# Patient Record
Sex: Male | Born: 1970 | Race: White | Hispanic: No | Marital: Single | Smoking: Former smoker
Health system: Southern US, Community
[De-identification: ages and names within clinical notes are randomized; demographics above are authoritative.]

## PROBLEM LIST (undated history)

## (undated) DIAGNOSIS — G8929 Other chronic pain: Secondary | ICD-10-CM

## (undated) DIAGNOSIS — Z87442 Personal history of urinary calculi: Secondary | ICD-10-CM

## (undated) DIAGNOSIS — R531 Weakness: Secondary | ICD-10-CM

## (undated) DIAGNOSIS — M549 Dorsalgia, unspecified: Secondary | ICD-10-CM

## (undated) DIAGNOSIS — M1990 Unspecified osteoarthritis, unspecified site: Secondary | ICD-10-CM

## (undated) HISTORY — PX: BACK SURGERY: SHX140

## (undated) HISTORY — PX: LASIK: SHX215

---

## 1996-11-11 HISTORY — PX: KNEE CARTILAGE SURGERY: SHX688

## 2012-01-19 ENCOUNTER — Emergency Department (HOSPITAL_COMMUNITY): Payer: BC Managed Care – PPO

## 2012-01-19 ENCOUNTER — Inpatient Hospital Stay (HOSPITAL_COMMUNITY)
Admission: EM | Admit: 2012-01-19 | Discharge: 2012-01-22 | DRG: 442 | Disposition: A | Discharge status: Home Health | Payer: BC Managed Care – PPO | Source: Ambulatory Visit | Attending: Surgery | Admitting: Surgery

## 2012-01-19 ENCOUNTER — Encounter (HOSPITAL_COMMUNITY): Payer: Self-pay | Admitting: *Deleted

## 2012-01-19 DIAGNOSIS — S91311A Laceration without foreign body, right foot, initial encounter: Secondary | ICD-10-CM | POA: Diagnosis present

## 2012-01-19 DIAGNOSIS — S0292XB Unspecified fracture of facial bones, initial encounter for open fracture: Secondary | ICD-10-CM

## 2012-01-19 DIAGNOSIS — S02402A Zygomatic fracture, unspecified, initial encounter for closed fracture: Secondary | ICD-10-CM | POA: Diagnosis present

## 2012-01-19 DIAGNOSIS — S0280XA Fracture of other specified skull and facial bones, unspecified side, initial encounter for closed fracture: Secondary | ICD-10-CM

## 2012-01-19 DIAGNOSIS — S0180XA Unspecified open wound of other part of head, initial encounter: Secondary | ICD-10-CM

## 2012-01-19 DIAGNOSIS — Z7289 Other problems related to lifestyle: Secondary | ICD-10-CM

## 2012-01-19 DIAGNOSIS — S02401A Maxillary fracture, unspecified, initial encounter for closed fracture: Secondary | ICD-10-CM | POA: Diagnosis present

## 2012-01-19 DIAGNOSIS — F1010 Alcohol abuse, uncomplicated: Secondary | ICD-10-CM | POA: Diagnosis present

## 2012-01-19 DIAGNOSIS — S02401B Maxillary fracture, unspecified, initial encounter for open fracture: Secondary | ICD-10-CM | POA: Diagnosis present

## 2012-01-19 DIAGNOSIS — S3991XA Unspecified injury of abdomen, initial encounter: Secondary | ICD-10-CM

## 2012-01-19 DIAGNOSIS — S91309A Unspecified open wound, unspecified foot, initial encounter: Secondary | ICD-10-CM

## 2012-01-19 DIAGNOSIS — R4182 Altered mental status, unspecified: Secondary | ICD-10-CM

## 2012-01-19 DIAGNOSIS — S02400B Malar fracture unspecified, initial encounter for open fracture: Secondary | ICD-10-CM | POA: Diagnosis present

## 2012-01-19 DIAGNOSIS — F1210 Cannabis abuse, uncomplicated: Secondary | ICD-10-CM | POA: Diagnosis present

## 2012-01-19 DIAGNOSIS — S0285XA Fracture of orbit, unspecified, initial encounter for closed fracture: Secondary | ICD-10-CM | POA: Diagnosis present

## 2012-01-19 DIAGNOSIS — S0181XA Laceration without foreign body of other part of head, initial encounter: Secondary | ICD-10-CM | POA: Diagnosis present

## 2012-01-19 DIAGNOSIS — S0280XB Fracture of other specified skull and facial bones, unspecified side, initial encounter for open fracture: Secondary | ICD-10-CM | POA: Diagnosis present

## 2012-01-19 DIAGNOSIS — T07XXXA Unspecified multiple injuries, initial encounter: Secondary | ICD-10-CM

## 2012-01-19 DIAGNOSIS — S058X9A Other injuries of unspecified eye and orbit, initial encounter: Secondary | ICD-10-CM | POA: Diagnosis present

## 2012-01-19 DIAGNOSIS — S0230XB Fracture of orbital floor, unspecified side, initial encounter for open fracture: Secondary | ICD-10-CM | POA: Diagnosis present

## 2012-01-19 DIAGNOSIS — T148XXA Other injury of unspecified body region, initial encounter: Secondary | ICD-10-CM

## 2012-01-19 DIAGNOSIS — F10929 Alcohol use, unspecified with intoxication, unspecified: Secondary | ICD-10-CM

## 2012-01-19 DIAGNOSIS — S298XXA Other specified injuries of thorax, initial encounter: Secondary | ICD-10-CM

## 2012-01-19 DIAGNOSIS — S91109A Unspecified open wound of unspecified toe(s) without damage to nail, initial encounter: Principal | ICD-10-CM | POA: Diagnosis present

## 2012-01-19 DIAGNOSIS — S01119A Laceration without foreign body of unspecified eyelid and periocular area, initial encounter: Secondary | ICD-10-CM

## 2012-01-19 DIAGNOSIS — S02109B Fracture of base of skull, unspecified side, initial encounter for open fracture: Secondary | ICD-10-CM | POA: Diagnosis present

## 2012-01-19 DIAGNOSIS — F1090 Alcohol use, unspecified, uncomplicated: Secondary | ICD-10-CM

## 2012-01-19 LAB — CBC
HCT: 50.7 % (ref 39.0–52.0)
Hemoglobin: 18.7 g/dL — ABNORMAL HIGH (ref 13.0–17.0)
MCV: 86.8 fL (ref 78.0–100.0)
RBC: 5.84 MIL/uL — ABNORMAL HIGH (ref 4.22–5.81)
WBC: 15.4 10*3/uL — ABNORMAL HIGH (ref 4.0–10.5)

## 2012-01-19 LAB — DIFFERENTIAL
Basophils Absolute: 0 10*3/uL (ref 0.0–0.1)
Basophils Relative: 0 % (ref 0–1)
Eosinophils Relative: 2 % (ref 0–5)
Lymphocytes Relative: 35 % (ref 12–46)
Monocytes Relative: 7 % (ref 3–12)
Neutro Abs: 8.6 10*3/uL — ABNORMAL HIGH (ref 1.7–7.7)

## 2012-01-19 LAB — BASIC METABOLIC PANEL
CO2: 18 mEq/L — ABNORMAL LOW (ref 19–32)
Chloride: 102 mEq/L (ref 96–112)
GFR calc Af Amer: >90 mL/min (ref >90)
Potassium: 3.4 mEq/L — ABNORMAL LOW (ref 3.5–5.1)

## 2012-01-19 LAB — TYPE AND SCREEN: Antibody Screen: NEGATIVE

## 2012-01-19 LAB — ETHANOL: Alcohol, Ethyl (B): 247 mg/dL — ABNORMAL HIGH (ref 0–11)

## 2012-01-19 MED ORDER — IOHEXOL 300 MG/ML  SOLN
100.0000 mL | Freq: Once | INTRAMUSCULAR | Status: AC | PRN
  Administered 2012-01-19: 100 mL via INTRAVENOUS

## 2012-01-19 MED ORDER — FENTANYL CITRATE 0.05 MG/ML IJ SOLN
50.0000 ug | Freq: Once | INTRAMUSCULAR | Status: AC
  Administered 2012-01-19: 50 ug via INTRAVENOUS
  Filled 2012-01-19: qty 2

## 2012-01-19 MED ORDER — SODIUM CHLORIDE 0.9 % IV BOLUS (SEPSIS): 1000.0000 mL | Freq: Once | INTRAVENOUS | Status: DC

## 2012-01-19 MED ORDER — SODIUM CHLORIDE 0.9 % IV BOLUS (SEPSIS)
2000.0000 mL | Freq: Once | INTRAVENOUS | Status: AC
  Administered 2012-01-19: 2000 mL via INTRAVENOUS

## 2012-01-19 NOTE — ED Notes (Signed)
X-ray at bedside

## 2012-01-19 NOTE — ED Notes (Signed)
Pt sleeping. 
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
## 2012-01-19 NOTE — ED Notes (Signed)
Patient transported to CT 
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
## 2012-01-20 ENCOUNTER — Inpatient Hospital Stay (HOSPITAL_COMMUNITY): Payer: BC Managed Care – PPO | Admitting: Anesthesiology

## 2012-01-20 ENCOUNTER — Encounter (HOSPITAL_COMMUNITY): Admission: EM | Disposition: A | Discharge status: Home Health | Payer: Self-pay | Source: Ambulatory Visit

## 2012-01-20 ENCOUNTER — Emergency Department (HOSPITAL_COMMUNITY): Payer: BC Managed Care – PPO

## 2012-01-20 ENCOUNTER — Encounter (HOSPITAL_COMMUNITY): Payer: Self-pay

## 2012-01-20 ENCOUNTER — Encounter (HOSPITAL_COMMUNITY): Payer: Self-pay | Admitting: Anesthesiology

## 2012-01-20 DIAGNOSIS — Z7289 Other problems related to lifestyle: Secondary | ICD-10-CM

## 2012-01-20 DIAGNOSIS — S0181XA Laceration without foreign body of other part of head, initial encounter: Secondary | ICD-10-CM | POA: Diagnosis present

## 2012-01-20 DIAGNOSIS — T148XXA Other injury of unspecified body region, initial encounter: Secondary | ICD-10-CM | POA: Diagnosis present

## 2012-01-20 DIAGNOSIS — S02402A Zygomatic fracture, unspecified, initial encounter for closed fracture: Secondary | ICD-10-CM | POA: Diagnosis present

## 2012-01-20 DIAGNOSIS — S0285XA Fracture of orbit, unspecified, initial encounter for closed fracture: Secondary | ICD-10-CM | POA: Diagnosis present

## 2012-01-20 DIAGNOSIS — S91311A Laceration without foreign body, right foot, initial encounter: Secondary | ICD-10-CM | POA: Diagnosis present

## 2012-01-20 DIAGNOSIS — S02401A Maxillary fracture, unspecified, initial encounter for closed fracture: Secondary | ICD-10-CM | POA: Diagnosis present

## 2012-01-20 DIAGNOSIS — F1290 Cannabis use, unspecified, uncomplicated: Secondary | ICD-10-CM | POA: Insufficient documentation

## 2012-01-20 HISTORY — PX: LACERATION REPAIR: SHX5284

## 2012-01-20 HISTORY — PX: I & D EXTREMITY: SHX5045

## 2012-01-20 LAB — BASIC METABOLIC PANEL
BUN: 10 mg/dL (ref 6–23)
CO2: 20 mEq/L (ref 19–32)
GFR calc non Af Amer: >90 mL/min (ref >90)
Glucose, Bld: 138 mg/dL — ABNORMAL HIGH (ref 70–99)
Potassium: 4.1 mEq/L (ref 3.5–5.1)
Sodium: 138 mEq/L (ref 135–145)

## 2012-01-20 LAB — URINALYSIS, ROUTINE W REFLEX MICROSCOPIC
Ketones, ur: NEGATIVE mg/dL
Leukocytes, UA: NEGATIVE
Nitrite: NEGATIVE
Specific Gravity, Urine: >1.046 — ABNORMAL HIGH (ref 1.005–1.030)
Urobilinogen, UA: 0.2 mg/dL (ref 0.0–1.0)
pH: 5 (ref 5.0–8.0)

## 2012-01-20 LAB — RAPID URINE DRUG SCREEN, HOSP PERFORMED
Barbiturates: NOT DETECTED
Benzodiazepines: NOT DETECTED
Cocaine: NOT DETECTED
Opiates: NOT DETECTED
Tetrahydrocannabinol: POSITIVE — AB

## 2012-01-20 LAB — CBC
HCT: 46.7 % (ref 39.0–52.0)
Hemoglobin: 16.6 g/dL (ref 13.0–17.0)
MCHC: 35.5 g/dL (ref 30.0–36.0)
RBC: 5.33 MIL/uL (ref 4.22–5.81)

## 2012-01-20 SURGERY — REPAIR, LACERATION, 2 OR MORE
Anesthesia: General | Site: Foot | Laterality: Right | Wound class: Dirty or Infected

## 2012-01-20 MED ORDER — NALOXONE HCL 0.4 MG/ML IJ SOLN: 0.4000 mg | INTRAMUSCULAR | Status: DC | PRN

## 2012-01-20 MED ORDER — ONDANSETRON HCL 4 MG/2ML IJ SOLN
4.0000 mg | Freq: Once | INTRAMUSCULAR | Status: AC
  Administered 2012-01-20: 4 mg via INTRAVENOUS
  Filled 2012-01-20: qty 2

## 2012-01-20 MED ORDER — SODIUM CHLORIDE 0.9 % IR SOLN
Status: DC | PRN
  Administered 2012-01-20: 2000 mL

## 2012-01-20 MED ORDER — CEFAZOLIN SODIUM 1-5 GM-% IV SOLN
1.0000 g | Freq: Once | INTRAVENOUS | Status: AC
  Administered 2012-01-20: 1 g via INTRAVENOUS
  Filled 2012-01-20: qty 50

## 2012-01-20 MED ORDER — HYDROMORPHONE HCL PF 1 MG/ML IJ SOLN
1.0000 mg | INTRAMUSCULAR | Status: DC | PRN
  Administered 2012-01-20 (×2): 1 mg via INTRAVENOUS
  Administered 2012-01-21 (×4): 2 mg via INTRAVENOUS
  Filled 2012-01-20 (×2): qty 2
  Filled 2012-01-20: qty 1
  Filled 2012-01-20: qty 2
  Filled 2012-01-20: qty 1
  Filled 2012-01-20: qty 2

## 2012-01-20 MED ORDER — FENTANYL CITRATE 0.05 MG/ML IJ SOLN
INTRAMUSCULAR | Status: DC | PRN
  Administered 2012-01-20 (×3): 100 ug via INTRAVENOUS

## 2012-01-20 MED ORDER — ACETAMINOPHEN 10 MG/ML IV SOLN
1000.0000 mg | Freq: Once | INTRAVENOUS | Status: AC | PRN
  Administered 2012-01-20: 1000 mg via INTRAVENOUS
  Filled 2012-01-20: qty 100

## 2012-01-20 MED ORDER — MORPHINE SULFATE 4 MG/ML IJ SOLN
4.0000 mg | Freq: Once | INTRAMUSCULAR | Status: AC
  Administered 2012-01-20: 4 mg via INTRAVENOUS
  Filled 2012-01-20: qty 1

## 2012-01-20 MED ORDER — PROPOFOL 10 MG/ML IV EMUL
INTRAVENOUS | Status: DC | PRN
  Administered 2012-01-20: 170 mg via INTRAVENOUS

## 2012-01-20 MED ORDER — NICOTINE 21 MG/24HR TD PT24
21.0000 mg | MEDICATED_PATCH | Freq: Every day | TRANSDERMAL | Status: DC
  Administered 2012-01-20 – 2012-01-22 (×3): 21 mg via TRANSDERMAL
  Filled 2012-01-20 (×3): qty 1

## 2012-01-20 MED ORDER — NEOSTIGMINE METHYLSULFATE 1 MG/ML IJ SOLN
INTRAMUSCULAR | Status: DC | PRN
  Administered 2012-01-20: 5 mg via INTRAVENOUS

## 2012-01-20 MED ORDER — TETANUS-DIPHTH-ACELL PERTUSSIS 5-2.5-18.5 LF-MCG/0.5 IM SUSP
0.5000 mL | Freq: Once | INTRAMUSCULAR | Status: AC
  Administered 2012-01-20: 0.5 mL via INTRAMUSCULAR
  Filled 2012-01-20: qty 0.5

## 2012-01-20 MED ORDER — BACITRACIN ZINC 500 UNIT/GM EX OINT
TOPICAL_OINTMENT | Freq: Two times a day (BID) | CUTANEOUS | Status: DC
  Administered 2012-01-20 – 2012-01-22 (×5): via TOPICAL
  Filled 2012-01-20 (×2): qty 15

## 2012-01-20 MED ORDER — ONDANSETRON HCL 4 MG/2ML IJ SOLN: 4.0000 mg | Freq: Once | INTRAMUSCULAR | Status: DC | PRN

## 2012-01-20 MED ORDER — LIDOCAINE-EPINEPHRINE 2 %-1:100000 IJ SOLN
INTRAMUSCULAR | Status: DC | PRN
  Administered 2012-01-20: 10 mL via INTRADERMAL

## 2012-01-20 MED ORDER — SUCCINYLCHOLINE CHLORIDE 20 MG/ML IJ SOLN
INTRAMUSCULAR | Status: DC | PRN
  Administered 2012-01-20: 100 mg via INTRAVENOUS

## 2012-01-20 MED ORDER — SODIUM CHLORIDE 0.9 % IJ SOLN: 9.0000 mL | INTRAMUSCULAR | Status: DC | PRN

## 2012-01-20 MED ORDER — VECURONIUM BROMIDE 10 MG IV SOLR
INTRAVENOUS | Status: DC | PRN
  Administered 2012-01-20: 4 mg via INTRAVENOUS

## 2012-01-20 MED ORDER — HYDROMORPHONE HCL PF 1 MG/ML IJ SOLN
INTRAMUSCULAR | Status: AC
  Filled 2012-01-20: qty 1

## 2012-01-20 MED ORDER — ACETAMINOPHEN 325 MG PO TABS: 650.0000 mg | ORAL_TABLET | ORAL | Status: DC | PRN

## 2012-01-20 MED ORDER — DIPHENHYDRAMINE HCL 50 MG/ML IJ SOLN: 12.5000 mg | Freq: Four times a day (QID) | INTRAMUSCULAR | Status: DC | PRN

## 2012-01-20 MED ORDER — DIPHENHYDRAMINE HCL 12.5 MG/5ML PO ELIX
12.5000 mg | ORAL_SOLUTION | Freq: Four times a day (QID) | ORAL | Status: DC | PRN
  Administered 2012-01-21: 12.5 mg via ORAL
  Filled 2012-01-20: qty 5

## 2012-01-20 MED ORDER — MORPHINE SULFATE 2 MG/ML IJ SOLN
2.0000 mg | INTRAMUSCULAR | Status: DC | PRN
  Administered 2012-01-20 (×3): 2 mg via INTRAVENOUS
  Filled 2012-01-20 (×4): qty 1

## 2012-01-20 MED ORDER — OXYCODONE HCL 5 MG PO TABS: 5.0000 mg | ORAL_TABLET | ORAL | Status: DC | PRN

## 2012-01-20 MED ORDER — CEFAZOLIN SODIUM 1-5 GM-% IV SOLN
1.0000 g | Freq: Three times a day (TID) | INTRAVENOUS | Status: DC
  Administered 2012-01-20 – 2012-01-21 (×4): 1 g via INTRAVENOUS
  Filled 2012-01-20 (×6): qty 50

## 2012-01-20 MED ORDER — ONDANSETRON HCL 4 MG PO TABS: 4.0000 mg | ORAL_TABLET | Freq: Four times a day (QID) | ORAL | Status: DC | PRN

## 2012-01-20 MED ORDER — ENOXAPARIN SODIUM 40 MG/0.4ML ~~LOC~~ SOLN
40.0000 mg | SUBCUTANEOUS | Status: DC
  Administered 2012-01-20 – 2012-01-21 (×2): 40 mg via SUBCUTANEOUS
  Filled 2012-01-20 (×3): qty 0.4

## 2012-01-20 MED ORDER — WHITE PETROLATUM GEL
Status: AC
  Filled 2012-01-20: qty 5

## 2012-01-20 MED ORDER — ONDANSETRON HCL 4 MG/2ML IJ SOLN: 4.0000 mg | Freq: Four times a day (QID) | INTRAMUSCULAR | Status: DC | PRN

## 2012-01-20 MED ORDER — HYDROMORPHONE 0.3 MG/ML IV SOLN
INTRAVENOUS | Status: AC
  Filled 2012-01-20: qty 25

## 2012-01-20 MED ORDER — MORPHINE SULFATE 4 MG/ML IJ SOLN: 4.0000 mg | Freq: Once | INTRAMUSCULAR | Status: DC

## 2012-01-20 MED ORDER — BSS IO SOLN
INTRAOCULAR | Status: DC | PRN
  Administered 2012-01-20: 1 via INTRAOCULAR

## 2012-01-20 MED ORDER — HYDROMORPHONE HCL PF 1 MG/ML IJ SOLN
0.2500 mg | INTRAMUSCULAR | Status: DC | PRN
  Administered 2012-01-20: 0.5 mg via INTRAVENOUS

## 2012-01-20 MED ORDER — ALUM & MAG HYDROXIDE-SIMETH 200-200-20 MG/5ML PO SUSP
30.0000 mL | Freq: Four times a day (QID) | ORAL | Status: DC | PRN
  Administered 2012-01-20 – 2012-01-21 (×2): 30 mL via ORAL
  Filled 2012-01-20 (×5): qty 30

## 2012-01-20 MED ORDER — BACITRACIN-NEOMYCIN-POLYMYXIN 400-5-5000 EX OINT
TOPICAL_OINTMENT | CUTANEOUS | Status: DC | PRN
  Administered 2012-01-20: 1 via TOPICAL

## 2012-01-20 MED ORDER — HYDROMORPHONE 0.3 MG/ML IV SOLN
INTRAVENOUS | Status: DC
  Administered 2012-01-20: 11:00:00 via INTRAVENOUS
  Administered 2012-01-20: 5.7 mg via INTRAVENOUS
  Administered 2012-01-20: 13 mg via INTRAVENOUS

## 2012-01-20 MED ORDER — DROPERIDOL 2.5 MG/ML IJ SOLN
INTRAMUSCULAR | Status: DC | PRN
  Administered 2012-01-20: 0.625 mg via INTRAVENOUS

## 2012-01-20 MED ORDER — DEXAMETHASONE SODIUM PHOSPHATE 4 MG/ML IJ SOLN
INTRAMUSCULAR | Status: DC | PRN
  Administered 2012-01-20: 6 mg via INTRAVENOUS

## 2012-01-20 MED ORDER — OXYCODONE HCL 5 MG PO TABS: 10.0000 mg | ORAL_TABLET | ORAL | Status: DC | PRN

## 2012-01-20 MED ORDER — SODIUM CHLORIDE 0.9 % IV SOLN: 0.1000 mg/kg | Freq: Once | INTRAVENOUS | Status: DC | PRN

## 2012-01-20 MED ORDER — LACTATED RINGERS IV SOLN
INTRAVENOUS | Status: DC | PRN
  Administered 2012-01-20: 03:00:00 via INTRAVENOUS

## 2012-01-20 MED ORDER — GLYCOPYRROLATE 0.2 MG/ML IJ SOLN
INTRAMUSCULAR | Status: DC | PRN
  Administered 2012-01-20: .6 mg via INTRAVENOUS

## 2012-01-20 MED ORDER — HYDROMORPHONE HCL PF 1 MG/ML IJ SOLN: 0.2500 mg | INTRAMUSCULAR | Status: DC | PRN

## 2012-01-20 MED ORDER — ONDANSETRON HCL 4 MG/2ML IJ SOLN
INTRAMUSCULAR | Status: DC | PRN
  Administered 2012-01-20: 4 mg via INTRAVENOUS

## 2012-01-20 MED ORDER — LIDOCAINE HCL (CARDIAC) 20 MG/ML IV SOLN
INTRAVENOUS | Status: DC | PRN
  Administered 2012-01-20: 40 mg via INTRAVENOUS
  Administered 2012-01-20: 100 mg via INTRAVENOUS

## 2012-01-20 MED ORDER — SODIUM CHLORIDE 0.9 % IV SOLN
INTRAVENOUS | Status: DC | PRN
  Administered 2012-01-20: 02:00:00 via INTRAVENOUS

## 2012-01-20 MED ORDER — SODIUM CHLORIDE 0.9 % IV SOLN
INTRAVENOUS | Status: DC
  Administered 2012-01-20 – 2012-01-21 (×3): via INTRAVENOUS

## 2012-01-20 SURGICAL SUPPLY — 30 items
BANDAGE COBAN STERILE 3 (GAUZE/BANDAGES/DRESSINGS) ×1 IMPLANT
BANDAGE ESMARK 6X9 LF (GAUZE/BANDAGES/DRESSINGS) IMPLANT
BLADE SURG 10 STRL SS (BLADE) ×1 IMPLANT
BLADE SURG 15 STRL LF DISP TIS (BLADE) IMPLANT
BLADE SURG 15 STRL SS (BLADE) ×3
BNDG CMPR 9X6 STRL LF SNTH (GAUZE/BANDAGES/DRESSINGS) ×4
BNDG COHESIVE 3X5 TAN STRL LF (GAUZE/BANDAGES/DRESSINGS) ×1 IMPLANT
BNDG ESMARK 6X9 LF (GAUZE/BANDAGES/DRESSINGS) ×6
COVER SURGICAL LIGHT HANDLE (MISCELLANEOUS) ×1 IMPLANT
DRAPE EXTREMITY T 121X128X90 (DRAPE) ×1 IMPLANT
GAUZE SPONGE 4X4 16PLY XRAY LF (GAUZE/BANDAGES/DRESSINGS) ×1 IMPLANT
GAUZE XEROFORM 1X8 LF (GAUZE/BANDAGES/DRESSINGS) ×1 IMPLANT
GLOVE BIO SURGEON STRL SZ 6.5 (GLOVE) ×1 IMPLANT
GLOVE BIO SURGEON STRL SZ7.5 (GLOVE) ×2 IMPLANT
GLOVE BIO SURGEON STRL SZ8.5 (GLOVE) ×1 IMPLANT
GLOVE BIOGEL PI IND STRL 7.0 (GLOVE) IMPLANT
GLOVE BIOGEL PI IND STRL 8 (GLOVE) IMPLANT
GLOVE BIOGEL PI INDICATOR 7.0 (GLOVE) ×1
GLOVE BIOGEL PI INDICATOR 8 (GLOVE) ×1
GLOVE ORTHO TXT STRL SZ7.5 (GLOVE) ×1 IMPLANT
GOWN STRL NON-REIN LRG LVL3 (GOWN DISPOSABLE) ×3 IMPLANT
NDL HYPO 25GX1X1/2 BEV (NEEDLE) IMPLANT
NEEDLE HYPO 25GX1X1/2 BEV (NEEDLE) ×3 IMPLANT
PAD ARMBOARD 7.5X6 YLW CONV (MISCELLANEOUS) ×1 IMPLANT
SPONGE GAUZE 4X4 12PLY (GAUZE/BANDAGES/DRESSINGS) ×1 IMPLANT
SUT CHROMIC 5 0 P 3 (SUTURE) ×1 IMPLANT
SUT ETHILON 5 0 P 3 18 (SUTURE) ×2
SUT NYLON ETHILON 5-0 P-3 1X18 (SUTURE) IMPLANT
TRAY ENT MC OR (CUSTOM PROCEDURE TRAY) ×1 IMPLANT
YANKAUER SUCT BULB TIP NO VENT (SUCTIONS) ×1 IMPLANT
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
## 2012-01-20 NOTE — ED Notes (Signed)
Pain meds given to assist in cleaning facial wounds.  Pt unable to tolerate even with morphine.

## 2012-01-20 NOTE — Anesthesia Postprocedure Evaluation (Signed)
  Anesthesia Post-op Note  Patient: Wayne Swanson  Procedure(s) Performed: Procedure(s) (LRB): REPAIR MULTIPLE LACERATIONS (Right) IRRIGATION AND DEBRIDEMENT EXTREMITY (Right)  Patient Location: PACU  Anesthesia Type: General  Level of Consciousness: awake, alert  and oriented  Airway and Oxygen Therapy: Patient Spontanous Breathing and Patient connected to nasal cannula oxygen  Post-op Pain: mild  Post-op Assessment: Post-op Vital signs reviewed and Patient's Cardiovascular Status Stable  Post-op Vital Signs: stable  Complications: No apparent anesthesia complications

## 2012-01-20 NOTE — Transfer of Care (Signed)
Immediate Anesthesia Transfer of Care Note  Patient: Wayne Swanson  Procedure(s) Performed: Procedure(s) (LRB): REPAIR MULTIPLE LACERATIONS (Right) IRRIGATION AND DEBRIDEMENT EXTREMITY (Right)  Patient Location: PACU  Anesthesia Type: General  Level of Consciousness: sedated and responds to stimulation  Airway & Oxygen Therapy: Patient Spontanous Breathing and Patient connected to face mask oxygen  Post-op Assessment: Report given to PACU RN, Post -op Vital signs reviewed and stable, Patient moving all extremities and Patient moving all extremities X 4  Post vital signs: Reviewed and stable  Complications: No apparent anesthesia complications

## 2012-01-20 NOTE — Preoperative (Signed)
Beta Blockers   Reason not to administer Beta Blockers:Not Applicable 
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
## 2012-01-20 NOTE — Progress Notes (Signed)
Pt set up on ETCO2 nasal cannula on PCA pump.  Alarm limits set and pt tolerating well at this time.  RT will continue to monitor.

## 2012-01-20 NOTE — Anesthesia Procedure Notes (Addendum)
Procedure Name: Intubation Date/Time: 01/20/2012 2:04 AM Performed by: Wray Kearns A Pre-anesthesia Checklist: Patient identified, Timeout performed, Emergency Drugs available, Suction available and Patient being monitored Patient Re-evaluated:Patient Re-evaluated prior to inductionOxygen Delivery Method: Circle system utilized Preoxygenation: Pre-oxygenation with 100% oxygen Intubation Type: IV induction, Rapid sequence and Cricoid Pressure applied Tube type: Oral Tube size: 8.0 mm Number of attempts: 1 Airway Equipment and Method: Rigid stylet and Video-laryngoscopy Placement Confirmation: ETT inserted through vocal cords under direct vision,  positive ETCO2,  CO2 detector and breath sounds checked- equal and bilateral Secured at: 23 cm Tube secured with: Tape Dental Injury: Teeth and Oropharynx as per pre-operative assessment  Comments: RSI ,Video laryngoscopy per Dr. Noreene Larsson   Date/Time: 01/20/2012 2:04 AM Performed by: Wray Kearns A Comments: Hard neck collar front removed for induction, neck kept neutral position and supported for intubation, then neck collar re-applied after intubation.

## 2012-01-20 NOTE — ED Notes (Signed)
Family updated on pt status
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
## 2012-01-20 NOTE — Progress Notes (Signed)
The patient is a smoker and is requesting a nicotine patch.  Will do so.  This patient has been seen and I agree with the findings and treatment plan.  Marta Lamas. Gae Bon, MD, FACS [phone] (pager) [phone] (direct pager) Trauma Surgeon
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
## 2012-01-20 NOTE — Progress Notes (Signed)
Patient ID: Wayne Swanson, male   DOB: [birth year], 41 y.o.   MRN: [id number] 01/19/2012 - 01/20/2012  2:33 AM  PATIENT:  Wayne Swanson  41 y.o. male  PRE-OPERATIVE DIAGNOSIS:  facial lacerations, right foot laceration  POST-OPERATIVE DIAGNOSIS:  facial lacerations, right foot laceration  PROCEDURE:  Procedure(s) (LRB):  IRRIGATION AND DEBRIDEMENT EXTREMITY (Right) foot and right great toe  SURGEON:  Surgeon(s) and Role: Panel 1:      Panel 2:    * Eldred Manges, MD - Primary  PHYSICIAN ASSISTANT:   ASSISTANTS: none   ANESTHESIA:   none  EBL:     BLOOD ADMINISTERED:none  DRAINS: none   LOCAL MEDICATIONS USED:  NONE  SPECIMEN:  No Specimen  DISPOSITION OF SPECIMEN:  N/A  COUNTS:  YES  TOURNIQUET:  * No tourniquets in log *  DICTATION: .Other Dictation: Dictation Number [id number]  PLAN OF CARE: Admit to inpatient   PATIENT DISPOSITION:  PACU - hemodynamically stable.   Delay start of Pharmacological VTE agent (>24hrs) due to surgical blood loss or risk of bleeding: not applicable
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
## 2012-01-20 NOTE — ED Notes (Signed)
Flow called for room placement.
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
## 2012-01-20 NOTE — Progress Notes (Signed)
Patient ID: Wayne Swanson, male   DOB: 1971-10-10, 41 y.o.   MRN: [id number]   LOS: 1 day   Subjective: C/o severe pain, mostly over abraded areas. Morphine not helping much at all.  Cleared C-spine.  Objective: Vital signs in last 24 hours: Temp:  [97.3 F (36.3 C)-99.6 F (37.6 C)] 99.6 F (37.6 C) (03/11 0706) Pulse Rate:  [60-170] 68  (03/11 0706) Resp:  [13-30] 20  (03/11 0706) BP: (128-161)/(58-130) 140/95 mmHg (03/11 0706) SpO2:  [94 %-100 %] 99 % (03/11 0706) Weight:  [85.73 kg (189 lb)] 85.73 kg (189 lb) (03/11 0556)    Lab Results:  CBC  Basename 01/19/12 2216  WBC 15.4*  HGB 18.7*  HCT 50.7  PLT 194   BMET  Basename 01/19/12 2216  NA 140  K 3.4*  CL 102  CO2 18*  GLUCOSE 146*  BUN 8  CREATININE 0.81  CALCIUM 8.7    General appearance: alert and moderate distress Resp: clear to auscultation bilaterally Cardio: Tachycardic GI: Soft, +BS. Incision/Wound: Fresh abrasions face, ant/post trunk, right hip, left knee  Assessment/Plan: [facility] Facial fxs/lacs s/p I&D, laceration repair -- per Dr. Barbette Merino Multiple abrasions -- Local care. WOC RN consulted. Right great toe lac s/p I&D, repair -- per Dr. Ophelia Charter EtOH FEN -- Will put on PCA VTE -- Lovenox Dispo -- PT/OT   [name], PA-C Pager: [phone] General Trauma PA Pager: [phone]   01/20/2012

## 2012-01-20 NOTE — ED Notes (Signed)
ORAL SURGERY AT BEDSIDE.

## 2012-01-21 MED ORDER — NAPROXEN 500 MG PO TABS
500.0000 mg | ORAL_TABLET | Freq: Two times a day (BID) | ORAL | Status: DC
  Administered 2012-01-21 – 2012-01-22 (×3): 500 mg via ORAL
  Filled 2012-01-21 (×5): qty 1

## 2012-01-21 MED ORDER — TRAMADOL HCL 50 MG PO TABS
100.0000 mg | ORAL_TABLET | Freq: Four times a day (QID) | ORAL | Status: DC
  Administered 2012-01-21 – 2012-01-22 (×4): 100 mg via ORAL
  Filled 2012-01-21 (×4): qty 2

## 2012-01-21 MED ORDER — HYDROMORPHONE HCL PF 1 MG/ML IJ SOLN
1.0000 mg | INTRAMUSCULAR | Status: DC | PRN
  Administered 2012-01-21 – 2012-01-22 (×2): 1 mg via INTRAVENOUS
  Filled 2012-01-21 (×2): qty 1

## 2012-01-21 MED ORDER — BETHANECHOL CHLORIDE 25 MG PO TABS
25.0000 mg | ORAL_TABLET | Freq: Four times a day (QID) | ORAL | Status: DC
  Administered 2012-01-21 – 2012-01-22 (×5): 25 mg via ORAL
  Filled 2012-01-21 (×8): qty 1

## 2012-01-21 MED ORDER — OXYCODONE HCL 5 MG PO TABS
10.0000 mg | ORAL_TABLET | ORAL | Status: DC | PRN
  Administered 2012-01-21 – 2012-01-22 (×5): 20 mg via ORAL
  Filled 2012-01-21 (×5): qty 4

## 2012-01-21 MED ORDER — WHITE PETROLATUM GEL
Status: AC
  Filled 2012-01-21: qty 5
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
## 2012-01-21 NOTE — Consult Note (Signed)
Follow up Ophthalmology  Less swelling on exam today.  PERRL vision 20/50 OD, mild decrease in adduction and superior gaze.  Mild subconjunctival heme temporally and the rest of the eye exam was wnl.  DFE was performed and retina was flat and intact.  Pt stable from ocular standpoint.  Pt to use Erythromycin ointment tid to keep eye lubricated so that does not close shut from drying mucous.  Lacrilube or Refresh Pm can be used in place of erythromycin ophthalmic ointment.  I will see patient as outpatient.  Pt to call my office upon discharge.  My office number is [phone] and the address is [address] [employer].  Please feel free to consult me if pt needs to be seen as inpatient.  My cellphone number is [phone].  Thank you for the consult.
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
## 2012-01-21 NOTE — Progress Notes (Signed)
Patient ID: Wayne Swanson, male   DOB: 1971/01/22, 41 y.o.   MRN: [id number]   LOS: 2 days   Subjective: Dilaudid worked much better for pain but seemed to get oversedated on it. Having some difficulty with urination, seems to be directly related to the pain medicine.  Objective: Vital signs in last 24 hours: Temp:  [98.1 F (36.7 C)-99.8 F (37.7 C)] 98.1 F (36.7 C) (03/12 0544) Pulse Rate:  [121-137] 130  (03/12 0544) Resp:  [10-20] 20  (03/12 0544) BP: (146-162)/(80-107) 151/80 mmHg (03/12 0544) SpO2:  [94 %-100 %] 97 % (03/12 0544)     General appearance: alert and no distress Resp: rales base - right Cardio: Tachycardic GI: normal findings: bowel sounds normal and soft, non-tender  Assessment/Plan: [name]  Facial fxs/lacs s/p I&D, laceration repair -- per Dr. Barbette Merino  Multiple abrasions -- Local care. WOC RN consulted.  Right great toe lac s/p I&D, repair -- per Dr. Ophelia Charter  EtOH  FEN -- Convert to orals for pain control. Add urecholine to aid urination. VTE -- Lovenox  Dispo -- PT/OT    [name], PA-C Pager: [phone] General Trauma PA Pager: [phone]   01/21/2012
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
## 2012-01-21 NOTE — Progress Notes (Signed)
Plan discussed with patient and family Patient examined and I agree with the assessment and plan  Violeta Gelinas, MD, MPH, FACS Pager: [phone]  01/21/2012 10:40 AM
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
## 2012-01-22 MED ORDER — OXYCODONE-ACETAMINOPHEN 10-325 MG PO TABS: 1.0000 | ORAL_TABLET | ORAL | Status: AC | PRN

## 2012-01-22 MED ORDER — BETHANECHOL CHLORIDE 10 MG PO TABS: 10.0000 mg | ORAL_TABLET | Freq: Four times a day (QID) | ORAL | Status: DC

## 2012-01-22 MED ORDER — TRAMADOL HCL 50 MG PO TABS: 100.0000 mg | ORAL_TABLET | Freq: Four times a day (QID) | ORAL | Status: AC

## 2012-01-22 MED ORDER — SILVER SULFADIAZINE 1 % EX CREA: TOPICAL_CREAM | Freq: Every day | CUTANEOUS | Status: DC

## 2012-01-22 MED ORDER — BACITRACIN ZINC 500 UNIT/GM EX OINT: TOPICAL_OINTMENT | CUTANEOUS | Status: DC

## 2012-01-22 MED ORDER — NAPROXEN 500 MG PO TABS: 500.0000 mg | ORAL_TABLET | Freq: Two times a day (BID) | ORAL | Status: AC

## 2012-01-22 MED ORDER — ERYTHROMYCIN 5 MG/GM OP OINT: TOPICAL_OINTMENT | Freq: Three times a day (TID) | OPHTHALMIC | Status: AC

## 2012-01-22 NOTE — Progress Notes (Signed)
PT Cancellation/ Discharge Note  Treatment cancelled today due to pt preparing for d/c.  Discussed stairs into house as well as ambulation with RW, WB'ing through RLE, and ROM of right ankle.  PT signing off for d/c, no further needs.  Maurianna Benard, [place]  [phone] 01/22/2012, 11:46 AM
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
## 2012-01-22 NOTE — Progress Notes (Signed)
Patient ID: Wayne Swanson, male   DOB: [birth year], 41 y.o.   MRN: [id number]   LOS: 3 days   Subjective: A bit more sore today but ready to go home.  Objective: Vital signs in last 24 hours: Temp:  [97 F (36.1 C)-98.8 F (37.1 C)] 98.5 F (36.9 C) (03/13 0559) Pulse Rate:  [107-118] 107  (03/13 0559) Resp:  [16-20] 16  (03/13 0559) BP: (149-171)/(77-95) 158/89 mmHg (03/13 0559) SpO2:  [88 %-98 %] 94 % (03/13 0559)     General appearance: alert and no distress Resp: clear to auscultation bilaterally Cardio: regular rate and rhythm GI: Soft, +BS.  Assessment/Plan: [facility]  Facial fxs/lacs s/p I&D, laceration repair -- per Dr. Barbette Merino  Multiple abrasions -- Local care. WOC RN consulted.  Right great toe lac s/p I&D, repair -- per Dr. Ophelia Charter  [place]  Dispo -- Home today.    [name], PA-C Pager: [phone] General Trauma PA Pager: [phone]   01/22/2012

## 2012-01-22 NOTE — Discharge Instructions (Signed)
No driving while taking narcotic medication (e.g. Oxycodone).  Wound care  Face: Use erythromycin ointment in right eye three times a day to keep it lubricated. Wash face with gentle soap and water twice daily. Use antibiotic ointment of choice on facial abrasions at least twice daily and as often as needed to keep moist.  Body: Wash daily in shower with soap and water. Do not soak. Apply silvadene cream liberally over abrasions and cover with dry gauze or clean T-shirt. For wounds that are covered with the pink dressings, leave those on until you are seen in clinic. They may be taped in place. If they become soiled, remove and use the silvadene as above.  Foot: Leave dressing intact and keep dry (wrap in plastic bag when in shower).

## 2012-01-22 NOTE — Progress Notes (Signed)
Okay to go home  Wayne Swanson Wayne Swanson, III, MD, FACS [phone] Trauma Surgeon 

## 2012-01-22 NOTE — Discharge Summary (Signed)
Okay to go home  Cordero Surette O. Tab Rylee, III, MD, FACS [phone] Trauma Surgeon 

## 2012-01-22 NOTE — Progress Notes (Signed)
Pt d/c instructions were reviewed with him and family. Pt verbalized understanding of all instructions. Education provided. Scripts and copy of instructions given to pt. Pt d/c'd with belongings and some dressing supplies, HHRN to start tomorrow for wound care. Pt d/c'd via wheelchair with belongings and family,escorted by unit NT.
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
## 2012-01-25 ENCOUNTER — Other Ambulatory Visit (INDEPENDENT_AMBULATORY_CARE_PROVIDER_SITE_OTHER): Payer: Self-pay | Admitting: Orthopedic Surgery

## 2012-01-27 ENCOUNTER — Encounter (HOSPITAL_COMMUNITY): Payer: Self-pay | Admitting: Oral Surgery

## 2012-01-28 ENCOUNTER — Other Ambulatory Visit (INDEPENDENT_AMBULATORY_CARE_PROVIDER_SITE_OTHER): Payer: Self-pay | Admitting: General Surgery

## 2012-01-28 DIAGNOSIS — T148XXA Other injury of unspecified body region, initial encounter: Secondary | ICD-10-CM

## 2012-01-30 ENCOUNTER — Ambulatory Visit (INDEPENDENT_AMBULATORY_CARE_PROVIDER_SITE_OTHER): Payer: BC Managed Care – PPO | Admitting: Orthopedic Surgery

## 2012-01-30 ENCOUNTER — Encounter (INDEPENDENT_AMBULATORY_CARE_PROVIDER_SITE_OTHER): Payer: Self-pay

## 2012-01-30 VITALS — BP 146/98 | HR 74 | Temp 97.9°F | Resp 18 | Ht 69.0 in | Wt 172.0 lb

## 2012-01-30 DIAGNOSIS — Z09 Encounter for follow-up examination after completed treatment for conditions other than malignant neoplasm: Secondary | ICD-10-CM

## 2012-01-30 DIAGNOSIS — Z428 Encounter for other plastic and reconstructive surgery following medical procedure or healed injury: Secondary | ICD-10-CM

## 2012-01-30 NOTE — Progress Notes (Signed)
Patient ID: Wayne Swanson, male   DOB: [birth year], 41 y.o.   MRN: [id number]  Subjective Has seen Drs. Wayne Swanson, and Wayne Swanson. He says Wayne Swanson said we needed to refer him to plastics for possible repair of his right lower lid as he didn't know anybody. Wayne Swanson is continuing to follow his foot and plans to see him back in two weeks. His truncal and UE wounds are healing well and he has no c/o regarding those. He does note that his face is still numb and may be worsening.   Objective Facial, truncal, and UE wounds are all healing well without signs of cellulitis or necrosis.    Assessment & Plan [facility] Multiple abrasions -- CPM with cleaning and silvadene. Facial laceration -- Will refer patient to Wayne Swanson for an evaluation. I'm sure she can refer to oculoplastics at Wayne Swanson if that's available/appropriate.  F/u here prn.  Wayne Caldron, PA-C Pager: [phone] General Trauma PA Pager: [phone]

## 2012-01-30 NOTE — Patient Instructions (Signed)
Continue wound management as directed.

## 2012-02-06 ENCOUNTER — Encounter (HOSPITAL_BASED_OUTPATIENT_CLINIC_OR_DEPARTMENT_OTHER): Payer: Self-pay | Admitting: *Deleted

## 2012-02-11 ENCOUNTER — Other Ambulatory Visit: Payer: Self-pay | Admitting: Plastic Surgery

## 2012-02-13 ENCOUNTER — Encounter (HOSPITAL_BASED_OUTPATIENT_CLINIC_OR_DEPARTMENT_OTHER): Payer: Self-pay | Admitting: Anesthesiology

## 2012-02-13 ENCOUNTER — Encounter (HOSPITAL_BASED_OUTPATIENT_CLINIC_OR_DEPARTMENT_OTHER)
Admission: RE | Disposition: A | Discharge status: Home / Self Care | Payer: Self-pay | Source: Ambulatory Visit | Attending: Plastic Surgery

## 2012-02-13 ENCOUNTER — Encounter (HOSPITAL_BASED_OUTPATIENT_CLINIC_OR_DEPARTMENT_OTHER): Payer: Self-pay | Admitting: Plastic Surgery

## 2012-02-13 ENCOUNTER — Encounter (HOSPITAL_BASED_OUTPATIENT_CLINIC_OR_DEPARTMENT_OTHER): Payer: Self-pay | Admitting: *Deleted

## 2012-02-13 ENCOUNTER — Ambulatory Visit (HOSPITAL_BASED_OUTPATIENT_CLINIC_OR_DEPARTMENT_OTHER)
Admission: RE | Admit: 2012-02-13 | Discharge: 2012-02-13 | Disposition: A | Discharge status: Home / Self Care | Payer: BC Managed Care – PPO | Source: Ambulatory Visit | Attending: Plastic Surgery | Admitting: Plastic Surgery

## 2012-02-13 ENCOUNTER — Ambulatory Visit (HOSPITAL_BASED_OUTPATIENT_CLINIC_OR_DEPARTMENT_OTHER): Payer: BC Managed Care – PPO | Admitting: Anesthesiology

## 2012-02-13 ENCOUNTER — Encounter (HOSPITAL_BASED_OUTPATIENT_CLINIC_OR_DEPARTMENT_OTHER): Payer: Self-pay

## 2012-02-13 DIAGNOSIS — S058X9A Other injuries of unspecified eye and orbit, initial encounter: Secondary | ICD-10-CM | POA: Insufficient documentation

## 2012-02-13 DIAGNOSIS — S02400A Malar fracture unspecified, initial encounter for closed fracture: Secondary | ICD-10-CM | POA: Insufficient documentation

## 2012-02-13 DIAGNOSIS — S01119A Laceration without foreign body of unspecified eyelid and periocular area, initial encounter: Secondary | ICD-10-CM | POA: Insufficient documentation

## 2012-02-13 DIAGNOSIS — S0180XA Unspecified open wound of other part of head, initial encounter: Secondary | ICD-10-CM | POA: Insufficient documentation

## 2012-02-13 DIAGNOSIS — S0230XA Fracture of orbital floor, unspecified side, initial encounter for closed fracture: Secondary | ICD-10-CM | POA: Insufficient documentation

## 2012-02-13 DIAGNOSIS — S0280XA Fracture of other specified skull and facial bones, unspecified side, initial encounter for closed fracture: Secondary | ICD-10-CM | POA: Insufficient documentation

## 2012-02-13 DIAGNOSIS — S02401A Maxillary fracture, unspecified, initial encounter for closed fracture: Secondary | ICD-10-CM | POA: Insufficient documentation

## 2012-02-13 HISTORY — PX: ORIF ORBITAL FRACTURE: SHX5312

## 2012-02-13 LAB — POCT HEMOGLOBIN-HEMACUE: Hemoglobin: 15.5 g/dL (ref 13.0–17.0)

## 2012-02-13 SURGERY — OPEN REDUCTION INTERNAL FIXATION (ORIF) ORBITAL FRACTURE
Anesthesia: General | Site: Face | Laterality: Right | Wound class: Clean

## 2012-02-13 MED ORDER — DEXAMETHASONE SODIUM PHOSPHATE 4 MG/ML IJ SOLN
INTRAMUSCULAR | Status: DC | PRN
  Administered 2012-02-13: 10 mg via INTRAVENOUS

## 2012-02-13 MED ORDER — LIDOCAINE HCL (CARDIAC) 20 MG/ML IV SOLN
INTRAVENOUS | Status: DC | PRN
  Administered 2012-02-13: 100 mg via INTRAVENOUS

## 2012-02-13 MED ORDER — CEFAZOLIN SODIUM 1-5 GM-% IV SOLN
1.0000 g | INTRAVENOUS | Status: AC
  Administered 2012-02-13: 1 g via INTRAVENOUS

## 2012-02-13 MED ORDER — LIDOCAINE-EPINEPHRINE 1 %-1:100000 IJ SOLN
INTRAMUSCULAR | Status: DC | PRN
  Administered 2012-02-13: 3 mL

## 2012-02-13 MED ORDER — ERYTHROMYCIN 5 MG/GM OP OINT: TOPICAL_OINTMENT | Freq: Every day | OPHTHALMIC | Status: AC

## 2012-02-13 MED ORDER — BACITRACIN ZINC 500 UNIT/GM EX OINT
TOPICAL_OINTMENT | CUTANEOUS | Status: DC | PRN
  Administered 2012-02-13: 1 via TOPICAL

## 2012-02-13 MED ORDER — ONDANSETRON HCL 4 MG/2ML IJ SOLN
INTRAMUSCULAR | Status: DC | PRN
  Administered 2012-02-13: 4 mg via INTRAVENOUS

## 2012-02-13 MED ORDER — MORPHINE SULFATE 2 MG/ML IJ SOLN: 0.0500 mg/kg | INTRAMUSCULAR | Status: DC | PRN

## 2012-02-13 MED ORDER — HYDROCODONE-ACETAMINOPHEN 5-325 MG PO TABS: 1.0000 | ORAL_TABLET | Freq: Four times a day (QID) | ORAL | Status: AC | PRN

## 2012-02-13 MED ORDER — PROPOFOL 10 MG/ML IV EMUL
INTRAVENOUS | Status: DC | PRN
  Administered 2012-02-13: 150 mg via INTRAVENOUS

## 2012-02-13 MED ORDER — BSS IO SOLN
INTRAOCULAR | Status: DC | PRN
  Administered 2012-02-13: 15 mL via INTRAOCULAR

## 2012-02-13 MED ORDER — HYDROMORPHONE HCL PF 1 MG/ML IJ SOLN
0.2500 mg | INTRAMUSCULAR | Status: DC | PRN
  Administered 2012-02-13 (×4): 0.5 mg via INTRAVENOUS

## 2012-02-13 MED ORDER — ONDANSETRON HCL 4 MG/2ML IJ SOLN: 4.0000 mg | Freq: Once | INTRAMUSCULAR | Status: DC | PRN

## 2012-02-13 MED ORDER — HYDROCODONE-ACETAMINOPHEN 5-325 MG PO TABS: 1.0000 | ORAL_TABLET | Freq: Once | ORAL | Status: DC

## 2012-02-13 MED ORDER — LABETALOL HCL 5 MG/ML IV SOLN
5.0000 mg | Freq: Once | INTRAVENOUS | Status: AC
  Administered 2012-02-13: 5 mg via INTRAVENOUS

## 2012-02-13 MED ORDER — MIDAZOLAM HCL 5 MG/5ML IJ SOLN
INTRAMUSCULAR | Status: DC | PRN
  Administered 2012-02-13: 2 mg via INTRAVENOUS

## 2012-02-13 MED ORDER — LACTATED RINGERS IV SOLN
INTRAVENOUS | Status: DC
  Administered 2012-02-13 (×2): via INTRAVENOUS

## 2012-02-13 MED ORDER — AMOXICILLIN-POT CLAVULANATE 500-125 MG PO TABS: 1.0000 | ORAL_TABLET | Freq: Three times a day (TID) | ORAL | Status: AC

## 2012-02-13 MED ORDER — SUCCINYLCHOLINE CHLORIDE 20 MG/ML IJ SOLN
INTRAMUSCULAR | Status: DC | PRN
  Administered 2012-02-13: 100 mg via INTRAVENOUS

## 2012-02-13 MED ORDER — FENTANYL CITRATE 0.05 MG/ML IJ SOLN
INTRAMUSCULAR | Status: DC | PRN
  Administered 2012-02-13: 100 ug via INTRAVENOUS

## 2012-02-13 SURGICAL SUPPLY — 39 items
APL SRG 3 HI ABS STRL LF PLS (MISCELLANEOUS) ×1
APPLICATOR DR MATTHEWS STRL (MISCELLANEOUS) ×1 IMPLANT
CANISTER SUCTION 1200CC (MISCELLANEOUS) IMPLANT
DECANTER SPIKE VIAL GLASS SM (MISCELLANEOUS) ×2 IMPLANT
ELECT NDL BLADE 2-5/6 (NEEDLE) ×1 IMPLANT
ELECT NEEDLE BLADE 2-5/6 (NEEDLE) ×2 IMPLANT
ELECT REM PT RETURN 9FT ADLT (ELECTROSURGICAL) ×2
ELECTRODE REM PT RTRN 9FT ADLT (ELECTROSURGICAL) ×1 IMPLANT
GAUZE PACKING FOLDED 2  STR (GAUZE/BANDAGES/DRESSINGS) ×1
GAUZE PACKING FOLDED 2 STR (GAUZE/BANDAGES/DRESSINGS) IMPLANT
GAUZE SPONGE 4X4 16PLY XRAY LF (GAUZE/BANDAGES/DRESSINGS) ×1 IMPLANT
GLOVE BIO SURGEON STRL SZ 6.5 (GLOVE) ×3 IMPLANT
GLOVE ECLIPSE 6.5 STRL STRAW (GLOVE) ×1 IMPLANT
GLOVE SKINSENSE NS SZ7.0 (GLOVE) ×1
GLOVE SKINSENSE STRL SZ7.0 (GLOVE) IMPLANT
GOWN PREVENTION PLUS XLARGE (GOWN DISPOSABLE) ×3 IMPLANT
NDL HYPO 30GX1 BEV (NEEDLE) ×1 IMPLANT
NEEDLE HYPO 30GX1 BEV (NEEDLE) ×2 IMPLANT
NS IRRIG 1000ML POUR BTL (IV SOLUTION) ×1 IMPLANT
PACK BASIN DAY SURGERY FS (CUSTOM PROCEDURE TRAY) ×2 IMPLANT
PACK ENT DAY SURGERY (CUSTOM PROCEDURE TRAY) ×2 IMPLANT
PATTIES SURGICAL .5 X3 (DISPOSABLE) IMPLANT
PENCIL BUTTON HOLSTER BLD 10FT (ELECTRODE) ×2 IMPLANT
SHEILD EYE MED CORNL SHD 22X21 (OPHTHALMIC RELATED) ×2
SHIELD EYE MED CORNL SHD 22X21 (OPHTHALMIC RELATED) IMPLANT
SLEEVE SCD COMPRESS KNEE MED (MISCELLANEOUS) ×2 IMPLANT
STRIP CLOSURE SKIN 1/2X4 (GAUZE/BANDAGES/DRESSINGS) ×2 IMPLANT
SUT MON AB 5-0 P3 18 (SUTURE) ×1 IMPLANT
SUT PROLENE 5 0 P 3 (SUTURE) ×1 IMPLANT
SUT PROLENE 6 0 P 1 18 (SUTURE) ×1 IMPLANT
SUT SILK 6 0 P 1 (SUTURE) ×2 IMPLANT
SUT VIC AB 4-0 SH 27 (SUTURE) ×2
SUT VIC AB 4-0 SH 27XANBCTRL (SUTURE) IMPLANT
SUT VIC AB 5-0 P-3 18X BRD (SUTURE) IMPLANT
SUT VIC AB 5-0 P3 18 (SUTURE) ×2
SYR BULB 3OZ (MISCELLANEOUS) IMPLANT
TOWEL OR 17X24 6PK STRL BLUE (TOWEL DISPOSABLE) ×2 IMPLANT
TRAY DSU PREP LF (CUSTOM PROCEDURE TRAY) ×3 IMPLANT
WATER STERILE IRR 1000ML POUR (IV SOLUTION) IMPLANT

## 2012-02-13 NOTE — Anesthesia Procedure Notes (Addendum)
Performed by: Verlan Friends   Procedure Name: Intubation Date/Time: 02/13/2012 1:06 PM Performed by: Verlan Friends Pre-anesthesia Checklist: Patient identified, Emergency Drugs available, Suction available, Patient being monitored and Timeout performed Patient Re-evaluated:Patient Re-evaluated prior to inductionOxygen Delivery Method: Circle System Utilized Preoxygenation: Pre-oxygenation with 100% oxygen Intubation Type: IV induction Ventilation: Mask ventilation without difficulty Laryngoscope Size: Miller and 3 Grade View: Grade I Tube type: Oral Tube size: 8.0 mm Number of attempts: 1 Airway Equipment and Method: stylet Placement Confirmation: ETT inserted through vocal cords under direct vision,  positive ETCO2 and breath sounds checked- equal and bilateral Tube secured with: Tape Dental Injury: Teeth and Oropharynx as per pre-operative assessment

## 2012-02-13 NOTE — Anesthesia Postprocedure Evaluation (Signed)
Anesthesia Post Note  Patient: Wayne Swanson  Procedure(s) Performed: Procedure(s) (LRB): OPEN REDUCTION INTERNAL FIXATION (ORIF) ORBITAL FRACTURE (Right)  Anesthesia type: general  Patient location: PACU  Post pain: Pain level controlled  Post assessment: Patient's Cardiovascular Status Stable  Last Vitals:  Filed Vitals:   02/13/12 1545  BP: 143/97  Pulse: 86  Temp:   Resp: 17    Post vital signs: Reviewed and stable  Level of consciousness: sedated  Complications: No apparent anesthesia complications
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
## 2012-02-13 NOTE — Transfer of Care (Signed)
Immediate Anesthesia Transfer of Care Note  Patient: Wayne Swanson  Procedure(s) Performed: Procedure(s) (LRB): OPEN REDUCTION INTERNAL FIXATION (ORIF) ORBITAL FRACTURE (Right)  Patient Location: PACU  Anesthesia Type: General  Level of Consciousness: sedated, patient cooperative and responds to stimulation  Airway & Oxygen Therapy: Patient Spontanous Breathing and Patient connected to face mask oxygen  Post-op Assessment: Report given to PACU RN, Post -op Vital signs reviewed and stable and Patient moving all extremities  Post vital signs: Reviewed and stable  Complications: No apparent anesthesia complications

## 2012-02-13 NOTE — Brief Op Note (Signed)
02/13/2012  3:04 PM  PATIENT:  Alyse Low  41 y.o. male  PRE-OPERATIVE DIAGNOSIS:  right tripod fracture and orbital floor fracture, laceration of right eyebrow  POST-OPERATIVE DIAGNOSIS:  same  PROCEDURE:  Procedure(s) (LRB): Exploration of orbital fracture and tripod fracture.  Complex repair of right upper eye lid/brow laceration 1.5 x .5 cm  SURGEON:  Surgeon(s) and Role:    * Arrion Burruel Sanger, DO - Primary  PHYSICIAN ASSISTANT:   ASSISTANTS: none   ANESTHESIA:   local and general  EBL:  Total I/O In: 1650 [I.V.:1650] Out: -   BLOOD ADMINISTERED:none  DRAINS: none   LOCAL MEDICATIONS USED:  MARCAINE     SPECIMEN:  No Specimen  DISPOSITION OF SPECIMEN:  N/A  COUNTS:  YES  TOURNIQUET:  * No tourniquets in log *  DICTATION: dictated  PLAN OF CARE: Discharge to home after PACU  PATIENT DISPOSITION:  PACU - hemodynamically stable.   Delay start of Pharmacological VTE agent (>24hrs) due to surgical blood loss or risk of bleeding: no

## 2012-02-13 NOTE — Discharge Instructions (Addendum)
Keep head of bed elevated as able for 45 degrees. May use ice on your face for next 24 hours Soft diet for 1 week Keep steri strip in place May remove Kniss gauze in the morning May shower tomorrow   Post Anesthesia Home Care Instructions  Activity: Get plenty of rest for the remainder of the day. A responsible adult should stay with you for 24 hours following the procedure.  For the next 24 hours, DO NOT: -Drive a car -Advertising copywriter -Drink alcoholic beverages -Take any medication unless instructed by your physician -Make any legal decisions or sign important papers.  Meals: Start with liquid foods such as gelatin or soup. Progress to regular foods as tolerated. Avoid greasy, spicy, heavy foods. If nausea and/or vomiting occur, drink only clear liquids until the nausea and/or vomiting subsides. Call your physician if vomiting continues.  Special Instructions/Symptoms: Your throat may feel dry or sore from the anesthesia or the breathing tube placed in your throat during surgery. If this causes discomfort, gargle with warm salt water. The discomfort should disappear within 24 hours.

## 2012-02-13 NOTE — Anesthesia Preprocedure Evaluation (Signed)
Anesthesia Evaluation  Patient identified by MRN, date of birth, ID band Patient awake    Reviewed: Allergy & Precautions, H&P , NPO status , Patient's Chart, lab work & pertinent test results  Airway Mallampati: I TM Distance: >3 FB Neck ROM: full    Dental   Pulmonary          Cardiovascular     Neuro/Psych    GI/Hepatic   Endo/Other    Renal/GU      Musculoskeletal   Abdominal   Peds  Hematology   Anesthesia Other Findings   Reproductive/Obstetrics                           Anesthesia Physical Anesthesia Plan  ASA: II  Anesthesia Plan: General ETT   Post-op Pain Management:    Induction:   Airway Management Planned:   Additional Equipment:   Intra-op Plan:   Post-operative Plan:   Informed Consent: I have reviewed the patients History and Physical, chart, labs and discussed the procedure including the risks, benefits and alternatives for the proposed anesthesia with the patient or authorized representative who has indicated his/her understanding and acceptance.     Plan Discussed with: Anesthesiologist and Surgeon  Anesthesia Plan Comments:         Anesthesia Quick Evaluation

## 2012-02-14 ENCOUNTER — Encounter (HOSPITAL_BASED_OUTPATIENT_CLINIC_OR_DEPARTMENT_OTHER): Payer: Self-pay | Admitting: Plastic Surgery
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
## 2012-02-26 ENCOUNTER — Other Ambulatory Visit: Payer: Self-pay | Admitting: Orthopedic Surgery

## 2012-02-26 ENCOUNTER — Telehealth: Payer: Self-pay | Admitting: Orthopedic Surgery
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
## 2012-02-27 ENCOUNTER — Other Ambulatory Visit: Payer: Self-pay | Admitting: Orthopedic Surgery

## 2012-02-27 ENCOUNTER — Other Ambulatory Visit (INDEPENDENT_AMBULATORY_CARE_PROVIDER_SITE_OTHER): Payer: Self-pay | Admitting: General Surgery

## 2012-02-27 DIAGNOSIS — S02402A Zygomatic fracture, unspecified, initial encounter for closed fracture: Secondary | ICD-10-CM

## 2012-02-27 DIAGNOSIS — S01119A Laceration without foreign body of unspecified eyelid and periocular area, initial encounter: Secondary | ICD-10-CM

## 2012-02-27 DIAGNOSIS — S0230XA Fracture of orbital floor, unspecified side, initial encounter for closed fracture: Secondary | ICD-10-CM

## 2012-02-27 DIAGNOSIS — S0993XA Unspecified injury of face, initial encounter: Secondary | ICD-10-CM

## 2012-03-05 ENCOUNTER — Ambulatory Visit: Payer: BC Managed Care – PPO | Attending: Orthopedic Surgery | Admitting: Physical Therapy

## 2012-03-05 DIAGNOSIS — H8110 Benign paroxysmal vertigo, unspecified ear: Secondary | ICD-10-CM | POA: Insufficient documentation

## 2012-03-05 DIAGNOSIS — IMO0001 Reserved for inherently not codable concepts without codable children: Secondary | ICD-10-CM | POA: Insufficient documentation

## 2012-03-11 ENCOUNTER — Ambulatory Visit: Payer: BC Managed Care – PPO | Attending: Orthopedic Surgery | Admitting: Physical Therapy

## 2012-03-11 DIAGNOSIS — IMO0001 Reserved for inherently not codable concepts without codable children: Secondary | ICD-10-CM | POA: Insufficient documentation

## 2012-03-11 DIAGNOSIS — H8110 Benign paroxysmal vertigo, unspecified ear: Secondary | ICD-10-CM | POA: Insufficient documentation

## 2013-10-04 ENCOUNTER — Other Ambulatory Visit: Payer: Self-pay | Admitting: Neurosurgery

## 2013-10-04 DIAGNOSIS — M549 Dorsalgia, unspecified: Secondary | ICD-10-CM

## 2013-10-05 ENCOUNTER — Ambulatory Visit
Admission: RE | Admit: 2013-10-05 | Discharge: 2013-10-05 | Disposition: A | Discharge status: Home / Self Care | Payer: BC Managed Care – PPO | Source: Ambulatory Visit | Attending: Neurosurgery | Admitting: Neurosurgery

## 2013-10-05 VITALS — BP 102/59 | HR 73

## 2013-10-05 DIAGNOSIS — M549 Dorsalgia, unspecified: Secondary | ICD-10-CM

## 2013-10-05 MED ORDER — ONDANSETRON HCL 4 MG/2ML IJ SOLN
4.0000 mg | Freq: Once | INTRAMUSCULAR | Status: AC
  Administered 2013-10-05: 4 mg via INTRAMUSCULAR

## 2013-10-05 MED ORDER — DIAZEPAM 5 MG PO TABS
10.0000 mg | ORAL_TABLET | Freq: Once | ORAL | Status: AC
  Administered 2013-10-05: 10 mg via ORAL

## 2013-10-05 MED ORDER — IOHEXOL 180 MG/ML  SOLN
15.0000 mL | Freq: Once | INTRAMUSCULAR | Status: AC | PRN
  Administered 2013-10-05: 15 mL via INTRATHECAL

## 2013-10-05 MED ORDER — HYDROMORPHONE HCL PF 2 MG/ML IJ SOLN
2.0000 mg | Freq: Once | INTRAMUSCULAR | Status: AC
  Administered 2013-10-05: 2 mg via INTRAMUSCULAR

## 2013-10-05 NOTE — Progress Notes (Signed)
Still has some leg pain post pain meds. Will wait about 20 more minutes and re-medicate if needed.

## 2014-12-15 ENCOUNTER — Other Ambulatory Visit: Payer: Self-pay | Admitting: Neurosurgery

## 2014-12-27 ENCOUNTER — Encounter (HOSPITAL_COMMUNITY): Payer: Self-pay | Admitting: Pharmacy Technician

## 2014-12-30 ENCOUNTER — Encounter (HOSPITAL_COMMUNITY): Payer: Self-pay

## 2014-12-30 ENCOUNTER — Encounter (HOSPITAL_COMMUNITY)
Admission: RE | Admit: 2014-12-30 | Discharge: 2014-12-30 | Disposition: A | Discharge status: Home / Self Care | Payer: 59 | Source: Ambulatory Visit | Attending: Neurosurgery | Admitting: Neurosurgery

## 2014-12-30 DIAGNOSIS — M5126 Other intervertebral disc displacement, lumbar region: Secondary | ICD-10-CM | POA: Insufficient documentation

## 2014-12-30 DIAGNOSIS — Z0183 Encounter for blood typing: Secondary | ICD-10-CM | POA: Diagnosis not present

## 2014-12-30 DIAGNOSIS — Z01812 Encounter for preprocedural laboratory examination: Secondary | ICD-10-CM | POA: Insufficient documentation

## 2014-12-30 DIAGNOSIS — M5136 Other intervertebral disc degeneration, lumbar region: Secondary | ICD-10-CM | POA: Diagnosis not present

## 2014-12-30 HISTORY — DX: Dorsalgia, unspecified: M54.9

## 2014-12-30 HISTORY — DX: Unspecified osteoarthritis, unspecified site: M19.90

## 2014-12-30 HISTORY — DX: Personal history of urinary calculi: Z87.442

## 2014-12-30 HISTORY — DX: Other chronic pain: G89.29

## 2014-12-30 HISTORY — DX: Weakness: R53.1

## 2014-12-30 LAB — CBC WITH DIFFERENTIAL/PLATELET
Basophils Absolute: 0 10*3/uL (ref 0.0–0.1)
Basophils Relative: 1 % (ref 0–1)
Eosinophils Absolute: 0.4 10*3/uL (ref 0.0–0.7)
Eosinophils Relative: 6 % — ABNORMAL HIGH (ref 0–5)
HEMATOCRIT: 43.5 % (ref 39.0–52.0)
HEMOGLOBIN: 14.8 g/dL (ref 13.0–17.0)
LYMPHS PCT: 32 % (ref 12–46)
Lymphs Abs: 2.1 10*3/uL (ref 0.7–4.0)
MCH: 28.8 pg (ref 26.0–34.0)
MCHC: 34 g/dL (ref 30.0–36.0)
MCV: 84.6 fL (ref 78.0–100.0)
Monocytes Absolute: 0.8 10*3/uL (ref 0.1–1.0)
Monocytes Relative: 12 % (ref 3–12)
NEUTROS ABS: 3.3 10*3/uL (ref 1.7–7.7)
Neutrophils Relative %: 49 % (ref 43–77)
Platelets: 149 10*3/uL — ABNORMAL LOW (ref 150–400)
RBC: 5.14 MIL/uL (ref 4.22–5.81)
RDW: 12.3 % (ref 11.5–15.5)
WBC: 6.6 10*3/uL (ref 4.0–10.5)

## 2014-12-30 LAB — TYPE AND SCREEN
ABO/RH(D): O NEG
ANTIBODY SCREEN: NEGATIVE

## 2014-12-30 LAB — SURGICAL PCR SCREEN
MRSA, PCR: NEGATIVE
Staphylococcus aureus: NEGATIVE

## 2014-12-30 NOTE — Pre-Procedure Instructions (Signed)
Wayne Swanson  12/30/2014   Your procedure is scheduled on:  Tues, Mar 1 @ 7:45 AM  [facility]  at 5:45 AM.  Call this number if you have problems the morning of surgery: [phone]   Remember:   Do not eat food or drink liquids after midnight.   Take these medicines the morning of surgery with A SIP OF WATER: Gabapentin(Neurontin) and Pain Pill(if needed)              No Goody's,BC's,Aleve,Aspirin,Ibuprofen,Fish Oil,or any Herbal Medications   Do not wear jewelry  Do not wear lotions, powders, or colognes. You may wear deodorant.  Men may shave face and neck.  Do not bring valuables to the hospital.  [facility] not [facility]                  for any belongings or valuables.               Contacts, dentures or bridgework may not be worn into surgery.  Leave suitcase in the car. After surgery it may be brought to your room.  For patients admitted to the hospital, discharge time is determined by your                treatment team.                   Special Instructions:  [place] - Preparing for Surgery  Before surgery, you can play an important role.  Because skin is not sterile, your skin needs to be as free of germs as possible.  You can reduce the number of germs on you skin by washing with CHG (chlorahexidine gluconate) soap before surgery.  CHG is an antiseptic cleaner which kills germs and bonds with the skin to continue killing germs even after washing.  Please DO NOT use if you have an allergy to CHG or antibacterial soaps.  If your skin becomes reddened/irritated stop using the CHG and inform your nurse when you arrive at Short Stay.  Do not shave (including legs and underarms) for at least 48 hours prior to the first CHG shower.  You may shave your face.  Please follow these instructions carefully:   1.  Shower with CHG Soap the night before surgery and the                                morning of Surgery.  2.  If you choose to wash your  hair, wash your hair first as usual with your       normal shampoo.  3.  After you shampoo, rinse your hair and body thoroughly to remove the                      Shampoo.  4.  Use CHG as you would any other liquid soap.  You can apply chg directly       to the skin and wash gently with scrungie or a clean washcloth.  5.  Apply the CHG Soap to your body ONLY FROM THE NECK DOWN.        Do not use on open wounds or open sores.  Avoid contact with your eyes,       ears, mouth and genitals (private parts).  Wash genitals (private parts)       with your normal soap.  6.  Wash thoroughly, paying special attention to the area where your surgery        will be performed.  7.  Thoroughly rinse your body with warm water from the neck down.  8.  DO NOT shower/wash with your normal soap after using and rinsing off       the CHG Soap.  9.  Pat yourself dry with a clean towel.            10.  Wear clean pajamas.            11.  Place clean sheets on your bed the night of your first shower and do not        sleep with pets.  Day of Surgery  Do not apply any lotions/deoderants the morning of surgery.  Please wear clean clothes to the hospital/surgery center.     Please read over the following fact sheets that you were given: Pain Booklet, Coughing and Deep Breathing, Blood Transfusion Information, MRSA Information and Surgical Site Infection Prevention

## 2014-12-30 NOTE — Progress Notes (Signed)
Pt doesn't have a cardiologist  Denies ever having an echo/stress test/heart cath  Denies EKG or CXR in past yr  Pt doesn't have a medical Md

## 2015-01-09 MED ORDER — CEFAZOLIN SODIUM-DEXTROSE 2-3 GM-% IV SOLR
2.0000 g | INTRAVENOUS | Status: AC
  Administered 2015-01-10: 2 g via INTRAVENOUS
  Filled 2015-01-09: qty 50

## 2015-01-09 MED ORDER — DEXAMETHASONE SODIUM PHOSPHATE 10 MG/ML IJ SOLN
10.0000 mg | INTRAMUSCULAR | Status: AC
  Administered 2015-01-10: 10 mg via INTRAVENOUS
  Filled 2015-01-09: qty 1

## 2015-01-10 ENCOUNTER — Inpatient Hospital Stay (HOSPITAL_COMMUNITY): Payer: 59 | Admitting: Certified Registered"

## 2015-01-10 ENCOUNTER — Encounter (HOSPITAL_COMMUNITY)
Admission: RE | Disposition: A | Discharge status: Home / Self Care | Payer: Self-pay | Source: Ambulatory Visit | Attending: Neurosurgery

## 2015-01-10 ENCOUNTER — Inpatient Hospital Stay (HOSPITAL_COMMUNITY): Payer: 59 | Admitting: Vascular Surgery

## 2015-01-10 ENCOUNTER — Inpatient Hospital Stay (HOSPITAL_COMMUNITY)
Admission: RE | Admit: 2015-01-10 | Discharge: 2015-01-12 | DRG: 460 | Disposition: A | Discharge status: Home / Self Care | Payer: 59 | Source: Ambulatory Visit | Attending: Neurosurgery | Admitting: Neurosurgery

## 2015-01-10 ENCOUNTER — Encounter (HOSPITAL_COMMUNITY): Payer: Self-pay | Admitting: Certified Registered"

## 2015-01-10 ENCOUNTER — Inpatient Hospital Stay (HOSPITAL_COMMUNITY): Payer: 59

## 2015-01-10 DIAGNOSIS — Z419 Encounter for procedure for purposes other than remedying health state, unspecified: Secondary | ICD-10-CM

## 2015-01-10 DIAGNOSIS — G8929 Other chronic pain: Secondary | ICD-10-CM | POA: Diagnosis present

## 2015-01-10 DIAGNOSIS — M4806 Spinal stenosis, lumbar region: Secondary | ICD-10-CM | POA: Diagnosis present

## 2015-01-10 DIAGNOSIS — Z87442 Personal history of urinary calculi: Secondary | ICD-10-CM

## 2015-01-10 DIAGNOSIS — M549 Dorsalgia, unspecified: Secondary | ICD-10-CM | POA: Diagnosis present

## 2015-01-10 DIAGNOSIS — M51369 Other intervertebral disc degeneration, lumbar region without mention of lumbar back pain or lower extremity pain: Secondary | ICD-10-CM | POA: Diagnosis present

## 2015-01-10 DIAGNOSIS — Z87891 Personal history of nicotine dependence: Secondary | ICD-10-CM

## 2015-01-10 DIAGNOSIS — M5136 Other intervertebral disc degeneration, lumbar region: Secondary | ICD-10-CM | POA: Diagnosis present

## 2015-01-10 DIAGNOSIS — M5116 Intervertebral disc disorders with radiculopathy, lumbar region: Secondary | ICD-10-CM | POA: Diagnosis present

## 2015-01-10 HISTORY — PX: MAXIMUM ACCESS (MAS)POSTERIOR LUMBAR INTERBODY FUSION (PLIF) 1 LEVEL: SHX6368

## 2015-01-10 SURGERY — FOR MAXIMUM ACCESS (MAS) POSTERIOR LUMBAR INTERBODY FUSION (PLIF) 1 LEVEL
Anesthesia: General | Site: Back

## 2015-01-10 MED ORDER — PHENOL 1.4 % MT LIQD: 1.0000 | OROMUCOSAL | Status: DC | PRN

## 2015-01-10 MED ORDER — HYDROMORPHONE HCL 1 MG/ML IJ SOLN
0.5000 mg | INTRAMUSCULAR | Status: DC | PRN
  Administered 2015-01-10 – 2015-01-11 (×12): 1 mg via INTRAVENOUS
  Filled 2015-01-10 (×11): qty 1

## 2015-01-10 MED ORDER — MIDAZOLAM HCL 2 MG/2ML IJ SOLN
INTRAMUSCULAR | Status: AC
  Filled 2015-01-10: qty 2

## 2015-01-10 MED ORDER — MIDAZOLAM HCL 5 MG/5ML IJ SOLN
INTRAMUSCULAR | Status: DC | PRN
  Administered 2015-01-10: 2 mg via INTRAVENOUS

## 2015-01-10 MED ORDER — ROCURONIUM BROMIDE 50 MG/5ML IV SOLN
INTRAVENOUS | Status: AC
  Filled 2015-01-10: qty 1

## 2015-01-10 MED ORDER — LIDOCAINE HCL (CARDIAC) 20 MG/ML IV SOLN
INTRAVENOUS | Status: AC
  Filled 2015-01-10: qty 5

## 2015-01-10 MED ORDER — EPHEDRINE SULFATE 50 MG/ML IJ SOLN
INTRAMUSCULAR | Status: AC
  Filled 2015-01-10: qty 1

## 2015-01-10 MED ORDER — BUPIVACAINE HCL (PF) 0.25 % IJ SOLN
INTRAMUSCULAR | Status: DC | PRN
  Administered 2015-01-10: 20 mL

## 2015-01-10 MED ORDER — SUFENTANIL CITRATE 50 MCG/ML IV SOLN
INTRAVENOUS | Status: DC | PRN
  Administered 2015-01-10: 10 ug via INTRAVENOUS
  Administered 2015-01-10: 20 ug via INTRAVENOUS
  Administered 2015-01-10: 10 ug via INTRAVENOUS

## 2015-01-10 MED ORDER — MENTHOL 3 MG MT LOZG: 1.0000 | LOZENGE | OROMUCOSAL | Status: DC | PRN

## 2015-01-10 MED ORDER — LACTATED RINGERS IV SOLN
INTRAVENOUS | Status: DC | PRN
  Administered 2015-01-10 (×2): via INTRAVENOUS

## 2015-01-10 MED ORDER — LIDOCAINE HCL (CARDIAC) 20 MG/ML IV SOLN
INTRAVENOUS | Status: DC | PRN
  Administered 2015-01-10: 60 mg via INTRAVENOUS

## 2015-01-10 MED ORDER — HYDROMORPHONE HCL 1 MG/ML IJ SOLN
INTRAMUSCULAR | Status: AC
  Filled 2015-01-10: qty 1

## 2015-01-10 MED ORDER — DIAZEPAM 5 MG PO TABS
5.0000 mg | ORAL_TABLET | Freq: Four times a day (QID) | ORAL | Status: DC | PRN
  Administered 2015-01-10: 10 mg via ORAL
  Administered 2015-01-10: 5 mg via ORAL
  Administered 2015-01-11 – 2015-01-12 (×5): 10 mg via ORAL
  Filled 2015-01-10 (×4): qty 2
  Filled 2015-01-10: qty 1
  Filled 2015-01-10 (×2): qty 2

## 2015-01-10 MED ORDER — SODIUM CHLORIDE 0.9 % IJ SOLN
3.0000 mL | Freq: Two times a day (BID) | INTRAMUSCULAR | Status: DC
  Administered 2015-01-10 – 2015-01-11 (×2): 3 mL via INTRAVENOUS

## 2015-01-10 MED ORDER — PROPOFOL 10 MG/ML IV BOLUS
INTRAVENOUS | Status: DC | PRN
  Administered 2015-01-10: 200 mg via INTRAVENOUS

## 2015-01-10 MED ORDER — OXYCODONE-ACETAMINOPHEN 5-325 MG PO TABS
ORAL_TABLET | ORAL | Status: AC
  Filled 2015-01-10: qty 2

## 2015-01-10 MED ORDER — SUCCINYLCHOLINE CHLORIDE 20 MG/ML IJ SOLN
INTRAMUSCULAR | Status: AC
  Filled 2015-01-10: qty 1

## 2015-01-10 MED ORDER — SUFENTANIL CITRATE 50 MCG/ML IV SOLN
INTRAVENOUS | Status: AC
  Filled 2015-01-10: qty 1

## 2015-01-10 MED ORDER — SODIUM CHLORIDE 0.9 % IJ SOLN
INTRAMUSCULAR | Status: AC
  Filled 2015-01-10: qty 10

## 2015-01-10 MED ORDER — DIAZEPAM 5 MG/ML IJ SOLN
INTRAMUSCULAR | Status: AC
  Filled 2015-01-10: qty 2

## 2015-01-10 MED ORDER — CEFAZOLIN SODIUM 1-5 GM-% IV SOLN
1.0000 g | Freq: Three times a day (TID) | INTRAVENOUS | Status: AC
  Administered 2015-01-10 (×2): 1 g via INTRAVENOUS
  Filled 2015-01-10 (×2): qty 50

## 2015-01-10 MED ORDER — DOCUSATE SODIUM 100 MG PO CAPS
100.0000 mg | ORAL_CAPSULE | Freq: Two times a day (BID) | ORAL | Status: DC
  Administered 2015-01-10 – 2015-01-12 (×5): 100 mg via ORAL
  Filled 2015-01-10 (×5): qty 1

## 2015-01-10 MED ORDER — ONDANSETRON HCL 4 MG/2ML IJ SOLN
INTRAMUSCULAR | Status: AC
  Filled 2015-01-10: qty 2

## 2015-01-10 MED ORDER — SODIUM CHLORIDE 0.9 % IV SOLN: 250.0000 mL | INTRAVENOUS | Status: DC

## 2015-01-10 MED ORDER — GABAPENTIN 600 MG PO TABS
600.0000 mg | ORAL_TABLET | Freq: Three times a day (TID) | ORAL | Status: DC
  Administered 2015-01-10 – 2015-01-12 (×6): 600 mg via ORAL
  Filled 2015-01-10 (×6): qty 1

## 2015-01-10 MED ORDER — OXYCODONE HCL 5 MG PO TABS
30.0000 mg | ORAL_TABLET | ORAL | Status: DC | PRN
  Administered 2015-01-10 – 2015-01-12 (×4): 30 mg via ORAL
  Filled 2015-01-10 (×4): qty 6

## 2015-01-10 MED ORDER — ARTIFICIAL TEARS OP OINT
TOPICAL_OINTMENT | OPHTHALMIC | Status: AC
  Filled 2015-01-10: qty 3.5

## 2015-01-10 MED ORDER — NICOTINE 21 MG/24HR TD PT24
21.0000 mg | MEDICATED_PATCH | Freq: Every day | TRANSDERMAL | Status: DC
  Administered 2015-01-10 – 2015-01-12 (×3): 21 mg via TRANSDERMAL
  Filled 2015-01-10 (×3): qty 1

## 2015-01-10 MED ORDER — SODIUM CHLORIDE 0.9 % IR SOLN
Status: DC | PRN
  Administered 2015-01-10: 08:00:00

## 2015-01-10 MED ORDER — VANCOMYCIN HCL 1000 MG IV SOLR
INTRAVENOUS | Status: AC
  Filled 2015-01-10: qty 1000

## 2015-01-10 MED ORDER — 0.9 % SODIUM CHLORIDE (POUR BTL) OPTIME
TOPICAL | Status: DC | PRN
  Administered 2015-01-10: 1000 mL

## 2015-01-10 MED ORDER — VANCOMYCIN HCL 1000 MG IV SOLR
INTRAVENOUS | Status: DC | PRN
  Administered 2015-01-10: 1000 mg via TOPICAL

## 2015-01-10 MED ORDER — PHENYLEPHRINE HCL 10 MG/ML IJ SOLN
INTRAMUSCULAR | Status: DC | PRN
  Administered 2015-01-10: 40 ug via INTRAVENOUS

## 2015-01-10 MED ORDER — OXYCODONE-ACETAMINOPHEN 5-325 MG PO TABS
1.0000 | ORAL_TABLET | ORAL | Status: DC | PRN
  Administered 2015-01-10 – 2015-01-12 (×7): 2 via ORAL
  Filled 2015-01-10 (×6): qty 2

## 2015-01-10 MED ORDER — SODIUM CHLORIDE 0.9 % IJ SOLN
3.0000 mL | INTRAMUSCULAR | Status: DC | PRN
Start: 2015-01-10 — End: 2015-01-11

## 2015-01-10 MED ORDER — ARTIFICIAL TEARS OP OINT
TOPICAL_OINTMENT | OPHTHALMIC | Status: DC | PRN
  Administered 2015-01-10: 1 via OPHTHALMIC

## 2015-01-10 MED ORDER — INFLUENZA VAC SPLIT QUAD 0.5 ML IM SUSY
0.5000 mL | PREFILLED_SYRINGE | INTRAMUSCULAR | Status: AC
  Administered 2015-01-11: 0.5 mL via INTRAMUSCULAR
  Filled 2015-01-10 (×2): qty 0.5

## 2015-01-10 MED ORDER — SUCCINYLCHOLINE CHLORIDE 20 MG/ML IJ SOLN
INTRAMUSCULAR | Status: DC | PRN
  Administered 2015-01-10: 120 mg via INTRAVENOUS

## 2015-01-10 MED ORDER — THROMBIN 20000 UNITS EX SOLR
CUTANEOUS | Status: DC | PRN
  Administered 2015-01-10: 09:00:00 via TOPICAL

## 2015-01-10 MED ORDER — DIAZEPAM 5 MG/ML IJ SOLN
5.0000 mg | Freq: Once | INTRAMUSCULAR | Status: AC
  Administered 2015-01-10: 5 mg via INTRAVENOUS

## 2015-01-10 MED ORDER — ACETAMINOPHEN 650 MG RE SUPP: 650.0000 mg | RECTAL | Status: DC | PRN

## 2015-01-10 MED ORDER — ONDANSETRON HCL 4 MG/2ML IJ SOLN
INTRAMUSCULAR | Status: DC | PRN
  Administered 2015-01-10: 4 mg via INTRAVENOUS

## 2015-01-10 MED ORDER — PROMETHAZINE HCL 25 MG/ML IJ SOLN: 6.2500 mg | INTRAMUSCULAR | Status: DC | PRN

## 2015-01-10 MED ORDER — HYDROMORPHONE HCL 1 MG/ML IJ SOLN
0.2500 mg | INTRAMUSCULAR | Status: DC | PRN
  Administered 2015-01-10 (×4): 0.5 mg via INTRAVENOUS

## 2015-01-10 MED ORDER — PROPOFOL 10 MG/ML IV BOLUS
INTRAVENOUS | Status: AC
  Filled 2015-01-10: qty 20

## 2015-01-10 MED ORDER — ONDANSETRON HCL 4 MG/2ML IJ SOLN
4.0000 mg | INTRAMUSCULAR | Status: DC | PRN
Start: 2015-01-10 — End: 2015-01-12
  Administered 2015-01-11: 4 mg via INTRAVENOUS
  Filled 2015-01-10: qty 2

## 2015-01-10 MED ORDER — HYDROCODONE-ACETAMINOPHEN 5-325 MG PO TABS
1.0000 | ORAL_TABLET | ORAL | Status: DC | PRN
  Administered 2015-01-10: 2 via ORAL
  Filled 2015-01-10: qty 2

## 2015-01-10 MED ORDER — ACETAMINOPHEN 325 MG PO TABS: 650.0000 mg | ORAL_TABLET | ORAL | Status: DC | PRN

## 2015-01-10 SURGICAL SUPPLY — 72 items
APL SKNCLS STERI-STRIP NONHPOA (GAUZE/BANDAGES/DRESSINGS) ×1
BAG DECANTER FOR FLEXI CONT (MISCELLANEOUS) ×3 IMPLANT
BENZOIN TINCTURE PRP APPL 2/3 (GAUZE/BANDAGES/DRESSINGS) ×3 IMPLANT
BLADE CLIPPER SURG (BLADE) IMPLANT
BRUSH SCRUB EZ PLAIN DRY (MISCELLANEOUS) ×3 IMPLANT
BUR CUTTER 7.0 ROUND (BURR) ×2 IMPLANT
BUR MATCHSTICK NEURO 3.0 LAGG (BURR) ×3 IMPLANT
CAGE COROENT 9X9X23-4 (Cage) ×4 IMPLANT
CLIP NEUROVISION LG (CLIP) ×2 IMPLANT
CLOSURE WOUND 1/2 X4 (GAUZE/BANDAGES/DRESSINGS) ×1
CONT SPEC 4OZ CLIKSEAL STRL BL (MISCELLANEOUS) ×4 IMPLANT
COVER BACK TABLE 24X17X13 BIG (DRAPES) IMPLANT
COVER BACK TABLE 60X90IN (DRAPES) ×3 IMPLANT
DRAPE C-ARM 42X72 X-RAY (DRAPES) ×3 IMPLANT
DRAPE C-ARMOR (DRAPES) ×3 IMPLANT
DRAPE LAPAROTOMY 100X72X124 (DRAPES) ×3 IMPLANT
DRAPE POUCH INSTRU U-SHP 10X18 (DRAPES) ×2 IMPLANT
DRAPE SURG 17X23 STRL (DRAPES) ×12 IMPLANT
DRSG OPSITE POSTOP 4X6 (GAUZE/BANDAGES/DRESSINGS) ×2 IMPLANT
DURAPREP 26ML APPLICATOR (WOUND CARE) ×3 IMPLANT
ELECT BLADE 4.0 EZ CLEAN MEGAD (MISCELLANEOUS)
ELECT REM PT RETURN 9FT ADLT (ELECTROSURGICAL) ×3
ELECTRODE BLDE 4.0 EZ CLN MEGD (MISCELLANEOUS) IMPLANT
ELECTRODE REM PT RTRN 9FT ADLT (ELECTROSURGICAL) ×1 IMPLANT
EVACUATOR 1/8 PVC DRAIN (DRAIN) IMPLANT
GAUZE SPONGE 4X4 12PLY STRL (GAUZE/BANDAGES/DRESSINGS) ×3 IMPLANT
GAUZE SPONGE 4X4 16PLY XRAY LF (GAUZE/BANDAGES/DRESSINGS) IMPLANT
GLOVE BIOGEL PI IND STRL 7.0 (GLOVE) IMPLANT
GLOVE BIOGEL PI INDICATOR 7.0 (GLOVE) ×6
GLOVE ECLIPSE 8.0 STRL XLNG CF (GLOVE) ×2 IMPLANT
GLOVE ECLIPSE 9.0 STRL (GLOVE) ×5 IMPLANT
GLOVE EXAM NITRILE LRG STRL (GLOVE) IMPLANT
GLOVE EXAM NITRILE MD LF STRL (GLOVE) IMPLANT
GLOVE EXAM NITRILE XL STR (GLOVE) IMPLANT
GLOVE EXAM NITRILE XS STR PU (GLOVE) IMPLANT
GLOVE SURG SS PI 6.5 STRL IVOR (GLOVE) ×6 IMPLANT
GOWN STRL REUS W/ TWL LRG LVL3 (GOWN DISPOSABLE) IMPLANT
GOWN STRL REUS W/ TWL XL LVL3 (GOWN DISPOSABLE) ×1 IMPLANT
GOWN STRL REUS W/TWL 2XL LVL3 (GOWN DISPOSABLE) IMPLANT
GOWN STRL REUS W/TWL LRG LVL3 (GOWN DISPOSABLE) ×6
GOWN STRL REUS W/TWL XL LVL3 (GOWN DISPOSABLE) ×9
KIT BASIN OR (CUSTOM PROCEDURE TRAY) ×3 IMPLANT
KIT NDL NVM5 EMG ELECT (KITS) IMPLANT
KIT NEEDLE NVM5 EMG ELECT (KITS) ×1 IMPLANT
KIT NEEDLE NVM5 EMG ELECTRODE (KITS) ×2
KIT ROOM TURNOVER OR (KITS) ×3 IMPLANT
LIQUID BAND (GAUZE/BANDAGES/DRESSINGS) ×2 IMPLANT
NEEDLE HYPO 22GX1.5 SAFETY (NEEDLE) ×3 IMPLANT
NS IRRIG 1000ML POUR BTL (IV SOLUTION) ×3 IMPLANT
PACK LAMINECTOMY NEURO (CUSTOM PROCEDURE TRAY) ×3 IMPLANT
ROD 35MM (Rod) ×2 IMPLANT
ROD 5.5X40MM (Rod) ×2 IMPLANT
SCREW LOCK (Screw) ×12 IMPLANT
SCREW LOCK FXNS SPNE MAS PL (Screw) IMPLANT
SCREW PLIF MAS 5.0X25MM (Screw) ×4 IMPLANT
SCREW SHANK 5.0X35 (Screw) ×4 IMPLANT
SCREW TULIP 5.5 (Screw) ×4 IMPLANT
SPONGE LAP 4X18 X RAY DECT (DISPOSABLE) IMPLANT
SPONGE SURGIFOAM ABS GEL 100 (HEMOSTASIS) ×2 IMPLANT
SPONGE SURGIFOAM ABS GEL SZ50 (HEMOSTASIS) IMPLANT
STRIP CLOSURE SKIN 1/2X4 (GAUZE/BANDAGES/DRESSINGS) ×1 IMPLANT
SUT VIC AB 0 CT1 18XCR BRD8 (SUTURE) ×2 IMPLANT
SUT VIC AB 0 CT1 8-18 (SUTURE) ×3
SUT VIC AB 2-0 CT1 18 (SUTURE) ×3 IMPLANT
SUT VIC AB 3-0 SH 8-18 (SUTURE) ×3 IMPLANT
SYR 20ML ECCENTRIC (SYRINGE) ×3 IMPLANT
TOWEL OR 17X24 6PK STRL BLUE (TOWEL DISPOSABLE) ×3 IMPLANT
TOWEL OR 17X26 10 PK STRL BLUE (TOWEL DISPOSABLE) ×3 IMPLANT
TRAP SPECIMEN MUCOUS 40CC (MISCELLANEOUS) ×2 IMPLANT
TRAY FOLEY CATH 14FRSI W/METER (CATHETERS) IMPLANT
TRAY FOLEY CATH 16FRSI W/METER (SET/KITS/TRAYS/PACK) ×2 IMPLANT
WATER STERILE IRR 1000ML POUR (IV SOLUTION) ×3 IMPLANT

## 2015-01-10 NOTE — Anesthesia Preprocedure Evaluation (Addendum)
Anesthesia Evaluation  Patient identified by MRN, date of birth, ID band Patient awake    Reviewed: Allergy & Precautions, NPO status , Patient's Chart, lab work & pertinent test results  Airway Mallampati: II  TM Distance: >3 FB Neck ROM: Full    Dental  (+) Teeth Intact, Dental Advisory Given, Chipped   Pulmonary former smoker,          Cardiovascular negative cardio ROS  Rhythm:Regular Rate:Normal     Neuro/Psych    GI/Hepatic negative GI ROS, Neg liver ROS,   Endo/Other  negative endocrine ROS  Renal/GU      Musculoskeletal  (+) Arthritis -,   Abdominal   Peds  Hematology   Anesthesia Other Findings   Reproductive/Obstetrics                            Anesthesia Physical Anesthesia Plan  ASA: II  Anesthesia Plan: General   Post-op Pain Management:    Induction: Intravenous  Airway Management Planned: Oral ETT  Additional Equipment: None  Intra-op Plan:   Post-operative Plan: Extubation in OR  Informed Consent: I have reviewed the patients History and Physical, chart, labs and discussed the procedure including the risks, benefits and alternatives for the proposed anesthesia with the patient or authorized representative who has indicated his/her understanding and acceptance.   Dental advisory given  Plan Discussed with: Anesthesiologist, Surgeon and CRNA  Anesthesia Plan Comments:        Anesthesia Quick Evaluation

## 2015-01-10 NOTE — Anesthesia Procedure Notes (Signed)
Procedure Name: Intubation Date/Time: 01/10/2015 7:39 AM Performed by: Charm BargesBUTLER, Kennith Morss R Pre-anesthesia Checklist: Patient identified, Emergency Drugs available, Suction available, Patient being monitored and Timeout performed Patient Re-evaluated:Patient Re-evaluated prior to inductionOxygen Delivery Method: Circle system utilized Preoxygenation: Pre-oxygenation with 100% oxygen Intubation Type: IV induction Ventilation: Mask ventilation without difficulty Laryngoscope Size: Mac and 3 Grade View: Grade I Tube type: Oral Tube size: 7.5 mm Number of attempts: 1 Airway Equipment and Method: Stylet Placement Confirmation: ETT inserted through vocal cords under direct vision,  positive ETCO2,  breath sounds checked- equal and bilateral and CO2 detector Secured at: 25 cm Tube secured with: Tape Dental Injury: Teeth and Oropharynx as per pre-operative assessment

## 2015-01-10 NOTE — H&P (Signed)
Wayne Swanson is an 44 y.o. male.   Chief Complaint: Back and bilateral leg pain HPI: 44 year old male status post previous left-sided L4-5 laminotomy microdiscectomy presents with intractable back and bilateral lower extremity pain left consistent with an L5 radiculopathy right consistent with an L4 radicular pattern. Patient has failed conservative management her workup demonstrates evidence of disc space collapse and residual facet arthropathy at L4-5. Patient is failed conservative management and presents now for L4-5 decompression and fusion.  Past Medical History  Diagnosis Date  . Weakness     numbness and tingling both feet  . Arthritis   . Chronic back pain     DDD and HNP  . History of kidney stones     Past Surgical History  Procedure Laterality Date  . Laceration repair  01/20/2012    Procedure: REPAIR MULTIPLE LACERATIONS;  Surgeon: Georgia LopesScott M Jensen, DDS;  Location[facility];  Service: Oral Surgery;  Laterality: Right;; also debridement of facial wounds  . I&d extremity  01/20/2012    Procedure: IRRIGATION AND DEBRIDEMENT EXTREMITY;  Surgeon: Eldred MangesMark C Yates, MD;  [facility];  Service: Orthopedics;  Laterality: Right; - debridement right foot:  great toe MP and IP joints  . Knee cartilage surgery  1998    left  . Lasik    . Orif orbital fracture  02/13/2012    Procedure: OPEN REDUCTION INTERNAL FIXATION (ORIF) ORBITAL FRACTURE;  Surgeon: Wayland Denislaire Sanger, DO;  Location: [facility];  Service: Plastics;  Laterality: Right;  Exploration of facial fractures with complex repair right brow laceration  . Back surgery      History reviewed. No pertinent family history. Social History:  reports that he has quit smoking. He has never used smokeless tobacco. He reports that he drinks alcohol. He reports that he does not use illicit drugs.  Allergies: No Known Allergies  Medications Prior to Admission  Medication Sig Dispense Refill  . gabapentin (NEURONTIN) 300 MG capsule  Take 600 mg by mouth 3 (three) times daily.    Marland Kitchen. oxyCODONE (ROXICODONE) 15 MG immediate release tablet Take 30 mg by mouth every 4 (four) hours as needed for pain.    . silver sulfADIAZINE (SILVADENE) 1 % cream USE AS DIRECTED (Patient not taking: Reported on 12/27/2014) 400 g 1    No results found for this or any previous visit (from the past 48 hour(s)). No results found.  Review of Systems  Constitutional: Negative.   HENT: Negative.   Eyes: Negative.   Respiratory: Negative.   Cardiovascular: Negative.   Gastrointestinal: Negative.   Genitourinary: Negative.   Musculoskeletal: Negative.   Skin: Negative.   Neurological: Negative.   Endo/Heme/Allergies: Negative.   Psychiatric/Behavioral: Negative.     Blood pressure 136/85, pulse 60, temperature 98.5 F (36.9 C), temperature source Oral, resp. rate 18, height 5\' 9"  (1.753 m), weight 75.751 kg (167 lb), SpO2 99 %. Physical Exam  Constitutional: He is oriented to person, place, and time. He appears well-developed and well-nourished. No distress.  HENT:  Head: Normocephalic and atraumatic.  Right Ear: External ear normal.  Left Ear: External ear normal.  Nose: Nose normal.  Mouth/Throat: Oropharynx is clear and moist.  Eyes: Conjunctivae and EOM are normal. Pupils are equal, round, and reactive to light.  Neck: Normal range of motion. Neck supple. No tracheal deviation present. No thyromegaly present.  Cardiovascular: Normal rate, regular rhythm and intact distal pulses.  Exam reveals no friction rub.   No murmur heard. Respiratory: Effort  normal and breath sounds normal. No respiratory distress. He has no wheezes.  GI: Soft. Bowel sounds are normal. He exhibits no distension. There is no tenderness.  Musculoskeletal: Normal range of motion. He exhibits no edema or tenderness.  Neurological: He is alert and oriented to person, place, and time. He has normal reflexes. No cranial nerve deficit. Coordination normal.  Skin: Skin  is warm and dry. No rash noted. He is not diaphoretic. No erythema. No pallor.  Psychiatric: He has a normal mood and affect. His behavior is normal. Judgment and thought content normal.     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 Patient presents now for L4-5 redo decompressive laminectomy and interbody fusion utilizing interbody peek cages and locally harvested autograft coupled with posterior lateral arthrodesis utilizing nonsegmental pedicle screw instrumentation. Risks and benefits of been explained. Patient wishes to proceed.  Noemie Devivo A 01/10/2015, 7:31 AM

## 2015-01-10 NOTE — Op Note (Signed)
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  L4-5 posterior lateral arthrodesis utilizing nonsegmental cortical pedicle screw fixation and local autograft  Surgeon:Shadrack Brummitt A.Jelani Vreeland, M.D.  Asst. Surgeon: Gerlene FeeKritzer  Anesthesia: General  Indication: 44 year old male status post left L4-5 laminotomy microscopic discectomy with continued severe back and radicular pain failing all conservative management. Workup demonstrates evidence of significant disc space collapse with foraminal stenosis at L4-5. Patient presents now for L4-5 decompression and fusion in hopes of improving his symptoms.  Operative note: After induction of anesthesia, patient position prone onto Wilson frame and a properly padded. Lumbar region prepped and draped. Incision made overlying L4-5. Dissection performed bilaterally. Retractor placed. Fluoroscopy used. Levels confirmed. Using intraoperative neural monitoring and fluoroscopy entry sites for cortical pedicle screw placement and L4 were made bilaterally at the inferior medial aspect of both pedicles. Pedicles were then drilled in a cephalad and lateral direction under fluoroscopic guidance again with continuous neural monitoring. The pilot holes were probed and found to be solidly within the bone. Each pilot hole was then tapped with a screw tap. Each screw tap hole was probed and found to be solidly within the bone.Nuvasive cortical pedicle screw were then placed into L4 bilaterally. Each screw was 5.0 x 35  mm. Decompressive laminotomies then performed bilaterally using Leksell rongeurs Kerrison years high-speed drill to remove the inferior three quarters of the lamina of L4 and the entire inferior facets bilaterally. The majority the superior facet bilaterally and the superior rim the L5 lamina. Previous laminotomy defect was dissected free and epidural scar was resected. Ligament flavum was elevated and resected. Decompressive foraminotomies were performed on course exiting L4 nerve roots bilaterally. Epidural venous plexus quite related and cut. Bilateral discectomies were then performed. Disc space distracted up to 9 mm. With a 9 mm distractor left on the patient's right side thecal sac and nerve roots were protected on the left side. Disc space was then reamed and scraped with various curettes. Soft tissues removed and interspace. After adequate endplate preparation and performed a 9 mm x 22 mm x 6 cage was impacted into the disc space and rotated into final position. Distractor was removed patient's right side. Thecal sac and nerve respect on the right side. Disc space once again reamed and scraped with various curettes. Soft tissues removed and interspace. Morselize autograft was packed in the interspace. A second cage was then impacted into place and rotated into final position. Fluoroscopy images demonstrated good position of the cages with good preservation of lordosis and restoration of disc space height. Pedicle screws were then placed at L5 bilaterally by first drilling pilot holes under fluoroscopic guidance and once again using a cortical pedicle screw trajectory. Each pedicle screw pilot hole was probed and found be solidly within bone. Each pilot hole was once again tapped and found to be solidly within the bone and then 25 mm screws were placed bilaterally at L5. Screw heads were placed on the screws at L4. Lateral residual facet and transverse processes were decorticated. Morselize autograft was  packed posterior laterally. Short segment titanium rods and placed over the screw heads at L4 and L5.  Locking caps and placed over the screws. Locking caps and engaged with the construct under slight compression. Final images revealed good position the bone graft and hardware at proper upper level with normal alignment of the spine. Wounds and irrigated one final time. Thank Meissen powder was placed in the deep wound space. Wounds and close in layers with Vicryl sutures. Steri-Strips sterile dressing were applied. No apparent complications. Patient tolerated the procedure well and he returns to the recovery room postop.

## 2015-01-10 NOTE — Transfer of Care (Signed)
Immediate Anesthesia Transfer of Care Note  Patient: Wayne Swanson  Procedure(s) Performed: Procedure(s): Lumbar four-five Maximum Access Surgery Posterior Lumbar Interbody Fusion (N/A)  Patient Location: PACU  Anesthesia Type:General  Level of Consciousness: awake and sedated  Airway & Oxygen Therapy: Patient Spontanous Breathing and Patient connected to nasal cannula oxygen  Post-op Assessment: Report given to RN, Post -op Vital signs reviewed and stable and Patient moving all extremities  Post vital signs: Reviewed and stable  Last Vitals:  Filed Vitals:   01/10/15 0546  BP: 136/85  Pulse: 60  Temp: 36.9 C  Resp: 18    Complications: No apparent anesthesia complications

## 2015-01-10 NOTE — Progress Notes (Signed)
PT Cancellation Note  Patient Details Name: Wayne Swanson MRN: [id number] DOB: [birth year]   Cancelled Treatment:    Reason Eval/Treat Not Completed: Patient declined, no reason specified Holding PT evaluation as pt reporting increased pain. Will follow up next available time.   Alvie HeidelbergFolan, Waldemar Siegel A 01/10/2015, 3:41 PM  Alvie HeidelbergShauna Folan, PT, DPT [phone]

## 2015-01-10 NOTE — Anesthesia Postprocedure Evaluation (Signed)
  Anesthesia Post-op Note  Patient: Wayne Swanson  Procedure(s) Performed: Procedure(s): Lumbar four-five Maximum Access Surgery Posterior Lumbar Interbody Fusion (N/A)  Patient Location: PACU  Anesthesia Type:General  Level of Consciousness: awake  Airway and Oxygen Therapy: Patient Spontanous Breathing  Post-op Pain: mild  Post-op Assessment: Post-op Vital signs reviewed  Post-op Vital Signs: Reviewed  Last Vitals:  Filed Vitals:   01/10/15 1101  BP:   Pulse: 92  Temp:   Resp: 15    Complications: No apparent anesthesia complications

## 2015-01-10 NOTE — Brief Op Note (Signed)
01/10/2015  9:57 AM  PATIENT:  Wayne Swanson  44 y.o. male  PRE-OPERATIVE DIAGNOSIS:  Herniated Nucleus Pulposus/Degenerative Disc Disease  POST-OPERATIVE DIAGNOSIS:  Herniated Nucleus Pulposus/Degenerative Disc Disease  PROCEDURE:  Procedure(s): Lumbar four-five Maximum Access Surgery Posterior Lumbar Interbody Fusion (N/A)  SURGEON:  Surgeon(s) and Role:    * Temple PaciniHenry A Angelika Jerrett, MD - Primary    * Reinaldo Meekerandy O Kritzer, MD - Assisting  PHYSICIAN ASSISTANT:   ASSISTANTS:    ANESTHESIA:   general  EBL:  Total I/O In: 1000 [I.V.:1000] Out: 405 [Urine:105; Blood:300]  BLOOD ADMINISTERED:none  DRAINS: none   LOCAL MEDICATIONS USED:  MARCAINE     SPECIMEN:  No Specimen  DISPOSITION OF SPECIMEN:  N/A  COUNTS:  YES  TOURNIQUET:  * No tourniquets in log *  DICTATION: .Dragon Dictation  PLAN OF CARE: Admit to inpatient   PATIENT DISPOSITION:  PACU - hemodynamically stable.   Delay start of Pharmacological VTE agent (>24hrs) due to surgical blood loss or risk of bleeding: yes

## 2015-01-10 NOTE — Progress Notes (Signed)
Pt. Going to sleep and then waking up at a 10, valium given IV per Dr Dalphine HandingPools order

## 2015-01-11 ENCOUNTER — Encounter (HOSPITAL_COMMUNITY): Payer: Self-pay | Admitting: General Practice

## 2015-01-11 MED ORDER — POLYETHYLENE GLYCOL 3350 17 G PO PACK
17.0000 g | PACK | Freq: Every day | ORAL | Status: DC
  Administered 2015-01-11 – 2015-01-12 (×2): 17 g via ORAL
  Filled 2015-01-11 (×2): qty 1

## 2015-01-11 NOTE — Progress Notes (Signed)
Postop day 1. Patient complains of a great deal of incisional pain. Feels abdominal tightness and some pain into his groin. No lower extremity pain. No complaints of numbness or weakness.  Awake and alert. Vital signs are stable. Urine output good. Abdomen soft with minimal tenderness. Good bowel sounds. Motor and sensory exam intact in both lower extremities. Dressing clean and dry.  Progressing slowly following lumbar decompression and fusion. Continue efforts at mobilization.

## 2015-01-11 NOTE — Progress Notes (Signed)
CARE MANAGEMENT NOTE 01/11/2015  Patient:  Wayne Swanson,Wayne Swanson   Account Number:  [id number]  Date Initiated:  01/11/2015  Documentation initiated by:  Jiles CrockerHANDLER,Alysabeth Scalia  Subjective/Objective Assessment:   ADMITTED FOR SURGERY     Action/Plan:   CM FOLLOWING FOR DCP   Anticipated DC Date:  01/14/2015   Anticipated DC Plan:  HOME/SELF CARE    DC Planning Services  CM consult         Status of service:  In process, will continue to follow  Per UR Regulation:  Reviewed for med. necessity/level of care/duration of stay  Comments:  3/2/2016Abelino Derrick- B Henok Heacock RN,BSN, MHA [phone]

## 2015-01-11 NOTE — Evaluation (Signed)
Physical Therapy Evaluation Patient Details Name: Wayne LowHarold D Force MRN: [id number] DOB: [birth year] Today's Date: 01/11/2015   History of Present Illness  44 y.o. s/p Lumbar four-five Maximum Access Surgery Posterior Lumbar Interbody Fusion.  Clinical Impression  Patient presents with increased pain and generalized weakness impacting safe mobility. Biggest limiting factor to mobility is pain. Will need to assess ability to negotiate steps next session. Education provided on back precautions. May need change in pain regiment to allow for more tolerance for activity. Will notify RN. Pt would benefit from skilled PT to improve transfers, gait, balance and mobility so pt can maximize independence and return to PLOF.    Follow Up Recommendations Home health PT;Supervision/Assistance - 24 hour    Equipment Recommendations  Rolling walker with 5" wheels    Recommendations for Other Services       Precautions / Restrictions Precautions Precautions: Back;Fall Precaution Booklet Issued: Yes (comment) Precaution Comments: Able to independently verbalize 3/3 back precautions. Required Braces or Orthoses: Spinal Brace Spinal Brace: Lumbar corset;Applied in sitting position Restrictions Weight Bearing Restrictions: No      Mobility  Bed Mobility Overal bed mobility: Needs Assistance Bed Mobility: Sit to Sidelying;Rolling Rolling: Modified independent (Device/Increase time) Sidelying to sit: Supervision     Sit to sidelying: Modified independent (Device/Increase time) General bed mobility comments: HOB flat, no use of rails to simulate home environment. Increased time and effort. Good demo of log roll technique.  Transfers Overall transfer level: Needs assistance Equipment used: Rolling walker (2 wheeled) Transfers: Sit to/from Stand Sit to Stand: Min guard         General transfer comment: cues for hand placement.  Ambulation/Gait Ambulation/Gait assistance: Min guard Ambulation  Distance (Feet): 125 Feet Assistive device: Rolling walker (2 wheeled) Gait Pattern/deviations: Step-through pattern;Decreased stride length;Shuffle;Trunk flexed   Gait velocity interpretation: Below normal speed for age/gender General Gait Details: Very guarded gait secondary to increased pain with mobility. Short, shuffling steps. Cues to relax shoulders and for upright posture.  Stairs            Wheelchair Mobility    Modified Rankin (Stroke Patients Only)       Balance Overall balance assessment: Needs assistance Sitting-balance support: Feet supported;No upper extremity supported Sitting balance-Leahy Scale: Good Sitting balance - Comments: Able to doff brace sitting EOB without difficulty, increased pain.   Standing balance support: During functional activity Standing balance-Leahy Scale: Poor Standing balance comment: Relient on RW for support/balance.                              Pertinent Vitals/Pain Pain Assessment: Faces Pain Score: 9  Faces Pain Scale: Hurts whole lot Pain Location: back, abdomen and testicles Pain Descriptors / Indicators: Sore;Aching Pain Intervention(s): Limited activity within patient's tolerance;Monitored during session;Repositioned;Premedicated before session    Home Living Family/patient expects to be discharged to:: Private residence Living Arrangements: Alone Available Help at Discharge: Family;Available 24 hours/day Type of Home: House Home Access: Stairs to enter Entrance Stairs-Rails: None Entrance Stairs-Number of Steps: 4 Home Layout: One level Home Equipment: Shower seat - built in;Adaptive equipment      Prior Function Level of Independence: Independent               Hand Dominance   Dominant Hand: Right    Extremity/Trunk Assessment   Upper Extremity Assessment: Defer to OT evaluation;Overall [facility]FL for tasks assessed  Lower Extremity Assessment: Generalized weakness          Communication   Communication: No difficulties  Cognition Arousal/Alertness: Awake/alert Behavior During Therapy: WFL for tasks assessed/performed Overall Cognitive Status: Within Functional Limits for tasks assessed                      General Comments General comments (skin integrity, edema, etc.): Education provided on changing positions every 30-45 mins.    Exercises        Assessment/Plan    PT Assessment Patient needs continued PT services  PT Diagnosis Difficulty walking;Acute pain   PT Problem List Decreased strength;Pain;Decreased activity tolerance;Decreased balance;Decreased mobility  PT Treatment Interventions Balance training;Gait training;Stair training;Functional mobility training;Therapeutic activities;Therapeutic exercise;Patient/family education   PT Goals (Current goals can be found in the Care Plan section) Acute Rehab PT Goals Patient Stated Goal: return to independence PT Goal Formulation: With patient Time For Goal Achievement: 01/25/15 Potential to Achieve Goals: Good    Frequency Min 5X/week   Barriers to discharge Decreased caregiver support however pt going to stay with mother at d/c.    Co-evaluation               End of Session Equipment Utilized During Treatment: Gait belt;Back brace Activity Tolerance: Patient limited by pain;Patient tolerated treatment well Patient left: in bed;with call bell/phone within reach;with bed alarm set;with family/visitor present Nurse Communication: Mobility status         Time: 1610-9604 PT Time Calculation (min) (ACUTE ONLY): 19 min   Charges:   PT Evaluation $Initial PT Evaluation Tier I: 1 Procedure     PT G CodesAlvie Heidelberg A 01/12/15, 4:48 PM  Alvie Heidelberg, PT, DPT [phone]

## 2015-01-11 NOTE — Evaluation (Signed)
Occupational Therapy Evaluation Patient Details Name: Wayne Swanson MRN: [id number] DOB: November 01, 1971 Today's Date: 01/11/2015    History of Present Illness 44 y.o. s/p Lumbar four-five Maximum Access Surgery Posterior Lumbar Interbody Fusion.   Clinical Impression   Pt s/p above. Pt independent with ADLs, PTA. Feel pt will benefit from acute OT to increase independence prior to d/c. Plan to practice LB ADLs, shower transfer, and reinforce precautions next session.    Follow Up Recommendations  No OT follow up;Supervision - Intermittent    Equipment Recommendations  3 in 1 bedside comode    Recommendations for Other Services       Precautions / Restrictions Precautions Precautions: Back;Fall Precaution Booklet Issued: Yes (comment) Precaution Comments: educated on back precautions Required Braces or Orthoses: Spinal Brace Spinal Brace: Lumbar corset;Applied in sitting position Restrictions Weight Bearing Restrictions: No      Mobility Bed Mobility Overal bed mobility: Needs Assistance Bed Mobility: Sidelying to Sit;Rolling;Sit to Sidelying Rolling: Supervision Sidelying to sit: Supervision     Sit to sidelying: Modified independent (Device/Increase time) General bed mobility comments: cues for technique.  Transfers Overall transfer level: Needs assistance Equipment used: Rolling walker (2 wheeled) Transfers: Sit to/from Stand Sit to Stand: Min guard         General transfer comment: cues for hand placement.    Balance  Min guard for ambulation with RW.                                          ADL Overall ADL's : Needs assistance/impaired                 Upper Body Dressing : Set up;Supervision/safety;Sitting   Lower Body Dressing: Sit to/from stand;Minimal assistance;With adaptive equipment   Toilet Transfer: Min guard;Ambulation;RW (bed)           Functional mobility during ADLs: Min guard;Rolling walker General ADL  Comments: Educated on back brace. Educated on use of cup for oral care and placement of grooming items to avoid breaking precautions. Discussed incorporating precautions into functional activities. Educated on lower body dressing technique and AE. Pt practiced with sockaid.      Vision     Perception     Praxis      Pertinent Vitals/Pain Pain Assessment: 0-10 Pain Score: 9  Pain Location: back and stomach Pain Intervention(s): Limited activity within patient's tolerance;Monitored during session;Repositioned     Hand Dominance Right   Extremity/Trunk Assessment Upper Extremity Assessment Upper Extremity Assessment: Overall WFL for tasks assessed   Lower Extremity Assessment Lower Extremity Assessment: Defer to PT evaluation       Communication Communication Communication: No difficulties   Cognition Arousal/Alertness: Awake/alert Behavior During Therapy: WFL for tasks assessed/performed Overall Cognitive Status: Within Functional Limits for tasks assessed                     General Comments       Exercises       Shoulder Instructions      Home Living Family/patient expects to be discharged to:: Private residence Living Arrangements: Alone Available Help at Discharge: Family;Available 24 hours/day (planning to stay with mother) Type of Home: House Home Access: Stairs to enter [employer] of Steps: 4 Entrance Stairs-Rails: None Home Layout: One level     Bathroom Shower/Tub: Walk-in shower;Door   Bathroom Toilet: Handicapped height  Home Equipment: Shower seat - built Designer, fashion/clothingin;Adaptive equipment Adaptive Equipment: Reacher        Prior Functioning/Environment Level of Independence: Independent             OT Diagnosis: Acute pain   OT Problem List: Decreased activity tolerance;Decreased knowledge of use of DME or AE;Decreased knowledge of precautions;Pain   OT Treatment/Interventions: Self-care/ADL training;DME and/or AE  instruction;Patient/family education;Balance training;Therapeutic activities    OT Goals(Current goals can be found in the care plan section) Acute Rehab OT Goals Patient Stated Goal: get back to how he was before OT Goal Formulation: With patient Time For Goal Achievement: 01/18/15 Potential to Achieve Goals: Good ADL Goals Pt Will Perform Grooming: with set-up;standing Pt Will Perform Lower Body Bathing: with set-up;sit to/from stand Pt Will Perform Lower Body Dressing: with set-up;sit to/from stand Pt Will Transfer to Toilet: with modified independence;ambulating Pt Will Perform Toileting - Clothing Manipulation and hygiene: with modified independence;sit to/from stand Pt Will Perform Tub/Shower Transfer: Shower transfer;with supervision;ambulating;shower seat;rolling walker Additional ADL Goal #1: Pt will independently verbalize and demonstrate 3/3 back precautions.  OT Frequency: Min 2X/week   Barriers to D/C:            Co-evaluation              End of Session Equipment Utilized During Treatment: Gait belt;Rolling walker;Back brace Nurse Communication: Other (comment) (in pain)  Activity Tolerance: Patient limited by pain Patient left: in bed;with call bell/phone within reach;with bed alarm set;with family/visitor present   Time: 1401-1420 OT Time Calculation (min): 19 min Charges:  OT General Charges $OT Visit: 1 Procedure OT Evaluation $Initial OT Evaluation Tier I: 1 Procedure G-CodesEarlie Raveling:    Trai Ells L OTR/L [phone] 01/11/2015, 2:34 PM

## 2015-01-11 NOTE — Progress Notes (Signed)
Patient arrived this afternoon refused PT, patient is still having a great deal of pain this evening is describing pain that is radiating from back to genitals and abdomen.Will continue to monitor and update as needed.

## 2015-01-12 MED ORDER — DIAZEPAM 5 MG PO TABS: 5.0000 mg | ORAL_TABLET | Freq: Four times a day (QID) | ORAL | Status: DC | PRN

## 2015-01-12 MED ORDER — OXYCODONE HCL 15 MG PO TABS: 30.0000 mg | ORAL_TABLET | ORAL | Status: AC | PRN

## 2015-01-12 NOTE — Progress Notes (Signed)
Discharge orders received. Pt for discharge home today. IV d/c'd. Dressing clean, dry, intact to lower back. Pt given discharge instructions and prescriptions with verbalized understanding. Family in room to assist with discharge. Staff brought pt downstairs via wheelchair.   

## 2015-01-12 NOTE — Discharge Instructions (Signed)

## 2015-01-12 NOTE — Discharge Summary (Signed)
Physician Discharge Summary  Patient ID: Wayne Swanson MRN: [id number] DOB/AGE: 42-Jul-1972 42 y.o.  Admit date: 01/10/2015 Discharge date: 01/12/2015  Admission Diagnoses:  Discharge Diagnoses:  Active Problems:   Degenerative disc disease, lumbar   Discharged Condition: good  Hospital Course: Patient admitted to the hospital where he underwent and, gait at L4-5 decompression and fusion. Postoperative patient has had significant incisional discomfort with some anterior abdominal wall discomfort. Bowel function is normal. He is mobilizing without difficulty. Ready for discharge home.  Consults:   Significant Diagnostic Studies:   Treatments:   Discharge Exam: Blood pressure 141/88, pulse 87, temperature 98.3 F (36.8 C), temperature source Oral, resp. rate 20, height 5\' 9"  (1.753 m), weight 75.751 kg (167 lb), SpO2 98 %. Awake and alert. Oriented and appropriate. Motor and sensory function intact. Wound clean and dry. Chest and abdomen benign.  Disposition: 01-Home or Self Care     Medication List    STOP taking these medications        silver sulfADIAZINE 1 % cream  Commonly known as:  SILVADENE      TAKE these medications        diazepam 5 MG tablet  Commonly known as:  VALIUM  Take 1-2 tablets (5-10 mg total) by mouth every 6 (six) hours as needed for muscle spasms.     gabapentin 300 MG capsule  Commonly known as:  NEURONTIN  Take 600 mg by mouth 3 (three) times daily.     oxyCODONE 15 MG immediate release tablet  Commonly known as:  ROXICODONE  Take 2 tablets (30 mg total) by mouth every 4 (four) hours as needed for pain.           Follow-up Information    Follow up with Temple PaciniPOOL,Rahil Passey A, MD.   Specialty:  Neurosurgery   Contact information:   [address] [place] [address] [phone]       Signed: Temple PaciniOOL,Niajah Sipos A 01/12/2015, 10:49 AM

## 2015-01-12 NOTE — Progress Notes (Signed)
Talked to patient about DCP; patient does not want any HHC services but request DME - rolling walker and 3:1 - equipment ordered and to be delivered to the room today prior to discharging home today; Alexis GoodellB Andrya Roppolo RN,BSN,MHA [phone]

## 2015-01-12 NOTE — Progress Notes (Signed)
Pt has discharge instructions and prescriptions. Equipment has been delivered to pt as well. Waiting for pt's transportation to finish discharge. Will continue to monitor pt.

## 2015-04-28 ENCOUNTER — Other Ambulatory Visit: Payer: Self-pay | Admitting: Neurosurgery

## 2015-04-28 DIAGNOSIS — M5416 Radiculopathy, lumbar region: Secondary | ICD-10-CM

## 2015-05-05 ENCOUNTER — Ambulatory Visit
Admission: RE | Admit: 2015-05-05 | Discharge: 2015-05-05 | Disposition: A | Discharge status: Home / Self Care | Payer: 59 | Source: Ambulatory Visit | Attending: Neurosurgery | Admitting: Neurosurgery

## 2015-05-05 DIAGNOSIS — M5416 Radiculopathy, lumbar region: Secondary | ICD-10-CM

## 2016-12-04 ENCOUNTER — Other Ambulatory Visit: Payer: Self-pay | Admitting: Pediatric Genetics

## 2016-12-04 DIAGNOSIS — M961 Postlaminectomy syndrome, not elsewhere classified: Secondary | ICD-10-CM

## 2017-03-14 DIAGNOSIS — M5416 Radiculopathy, lumbar region: Secondary | ICD-10-CM | POA: Diagnosis not present

## 2017-03-14 DIAGNOSIS — R030 Elevated blood-pressure reading, without diagnosis of hypertension: Secondary | ICD-10-CM | POA: Diagnosis not present

## 2017-03-14 DIAGNOSIS — M961 Postlaminectomy syndrome, not elsewhere classified: Secondary | ICD-10-CM | POA: Diagnosis not present

## 2017-03-17 ENCOUNTER — Other Ambulatory Visit: Payer: Self-pay | Admitting: Neurosurgery

## 2017-03-17 DIAGNOSIS — M961 Postlaminectomy syndrome, not elsewhere classified: Secondary | ICD-10-CM

## 2017-04-04 ENCOUNTER — Ambulatory Visit
Admission: RE | Admit: 2017-04-04 | Discharge: 2017-04-04 | Disposition: A | Discharge status: Home / Self Care | Payer: BLUE CROSS/BLUE SHIELD | Source: Ambulatory Visit | Attending: Neurosurgery | Admitting: Neurosurgery

## 2017-04-04 VITALS — BP 107/71 | HR 66

## 2017-04-04 DIAGNOSIS — M961 Postlaminectomy syndrome, not elsewhere classified: Secondary | ICD-10-CM

## 2017-04-04 DIAGNOSIS — M5136 Other intervertebral disc degeneration, lumbar region: Secondary | ICD-10-CM

## 2017-04-04 DIAGNOSIS — M5126 Other intervertebral disc displacement, lumbar region: Secondary | ICD-10-CM | POA: Diagnosis not present

## 2017-04-04 MED ORDER — IOPAMIDOL (ISOVUE-M 200) INJECTION 41%
15.0000 mL | Freq: Once | INTRAMUSCULAR | Status: AC
  Administered 2017-04-04: 15 mL via INTRATHECAL

## 2017-04-04 MED ORDER — DIAZEPAM 5 MG PO TABS
10.0000 mg | ORAL_TABLET | Freq: Once | ORAL | Status: AC
  Administered 2017-04-04: 10 mg via ORAL

## 2017-04-04 MED ORDER — KETOROLAC TROMETHAMINE 60 MG/2ML IM SOLN
60.0000 mg | Freq: Once | INTRAMUSCULAR | Status: AC
  Administered 2017-04-04: 60 mg via INTRAMUSCULAR

## 2017-04-04 NOTE — Discharge Instructions (Signed)

## 2017-05-23 DIAGNOSIS — H60531 Acute contact otitis externa, right ear: Secondary | ICD-10-CM | POA: Diagnosis not present

## 2017-06-09 DIAGNOSIS — M961 Postlaminectomy syndrome, not elsewhere classified: Secondary | ICD-10-CM | POA: Diagnosis not present

## 2017-06-09 DIAGNOSIS — M5416 Radiculopathy, lumbar region: Secondary | ICD-10-CM | POA: Diagnosis not present

## 2017-07-18 DIAGNOSIS — M5416 Radiculopathy, lumbar region: Secondary | ICD-10-CM | POA: Diagnosis not present

## 2017-07-18 DIAGNOSIS — R030 Elevated blood-pressure reading, without diagnosis of hypertension: Secondary | ICD-10-CM | POA: Diagnosis not present

## 2017-07-18 DIAGNOSIS — M961 Postlaminectomy syndrome, not elsewhere classified: Secondary | ICD-10-CM | POA: Diagnosis not present

## 2017-10-10 DIAGNOSIS — M961 Postlaminectomy syndrome, not elsewhere classified: Secondary | ICD-10-CM | POA: Diagnosis not present

## 2017-10-10 DIAGNOSIS — R030 Elevated blood-pressure reading, without diagnosis of hypertension: Secondary | ICD-10-CM | POA: Diagnosis not present

## 2017-10-10 DIAGNOSIS — M5416 Radiculopathy, lumbar region: Secondary | ICD-10-CM | POA: Diagnosis not present

## 2018-01-02 DIAGNOSIS — M5416 Radiculopathy, lumbar region: Secondary | ICD-10-CM | POA: Diagnosis not present

## 2018-01-02 DIAGNOSIS — I10 Essential (primary) hypertension: Secondary | ICD-10-CM | POA: Diagnosis not present

## 2018-01-02 DIAGNOSIS — M961 Postlaminectomy syndrome, not elsewhere classified: Secondary | ICD-10-CM | POA: Diagnosis not present

## 2018-12-04 DIAGNOSIS — R300 Dysuria: Secondary | ICD-10-CM | POA: Diagnosis not present

## 2018-12-04 DIAGNOSIS — Z13220 Encounter for screening for lipoid disorders: Secondary | ICD-10-CM | POA: Diagnosis not present

## 2018-12-04 DIAGNOSIS — R3911 Hesitancy of micturition: Secondary | ICD-10-CM | POA: Diagnosis not present

## 2019-01-20 DIAGNOSIS — M5416 Radiculopathy, lumbar region: Secondary | ICD-10-CM | POA: Diagnosis not present

## 2019-01-20 DIAGNOSIS — M961 Postlaminectomy syndrome, not elsewhere classified: Secondary | ICD-10-CM | POA: Diagnosis not present

## 2019-01-20 DIAGNOSIS — R030 Elevated blood-pressure reading, without diagnosis of hypertension: Secondary | ICD-10-CM | POA: Diagnosis not present

## 2019-01-22 ENCOUNTER — Telehealth: Payer: Self-pay

## 2019-01-22 ENCOUNTER — Other Ambulatory Visit: Payer: Self-pay | Admitting: Neurosurgery

## 2019-01-22 DIAGNOSIS — M5416 Radiculopathy, lumbar region: Secondary | ICD-10-CM

## 2019-01-22 NOTE — Telephone Encounter (Signed)
Phone call to patient to verify medication list and allergies for myelogram procedure. Pt did not have any medications to stop. Pt denies travel outside of state in the past 14 days. Pt denies cough, fever, or contact with a known sick person.

## 2019-02-05 ENCOUNTER — Inpatient Hospital Stay: Admission: RE | Admit: 2019-02-05 | Payer: BLUE CROSS/BLUE SHIELD | Source: Ambulatory Visit

## 2019-02-05 ENCOUNTER — Other Ambulatory Visit: Payer: BLUE CROSS/BLUE SHIELD

## 2019-03-05 ENCOUNTER — Inpatient Hospital Stay: Admission: RE | Admit: 2019-03-05 | Payer: BLUE CROSS/BLUE SHIELD | Source: Ambulatory Visit

## 2019-03-05 ENCOUNTER — Other Ambulatory Visit: Payer: BLUE CROSS/BLUE SHIELD

## 2019-03-24 IMAGING — XA DG MYELOGRAPHY LUMBAR INJ LUMBOSACRAL
14 of 19 series · 14 of 19 positions shown · non-contrast
Comparison: Plain films 07/27/2015.  MRI 02/01/2015.

CLINICAL DATA: Lumbosacral spondylosis without myelopathy. Lower
extremity radicular symptoms,
TECHNIQUE: Contiguous axial images were obtained through the Lumbar spine after
the intrathecal infusion of infusion. Coronal and sagittal
reconstructions were obtained of the axial image sets.

[Series 1: w lumbar spine ap · 0.15mm/px · 1 of 1 slices shown]
[im 1/1]
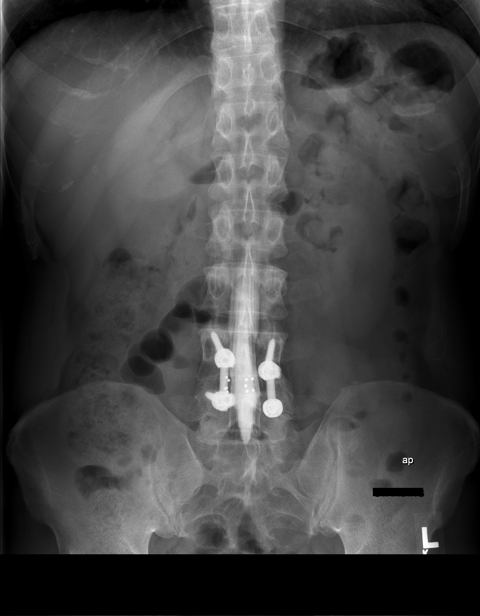

[Series 2: vasc standard · 1 of 1 slices shown (1 of 10)]
[im 1/1]
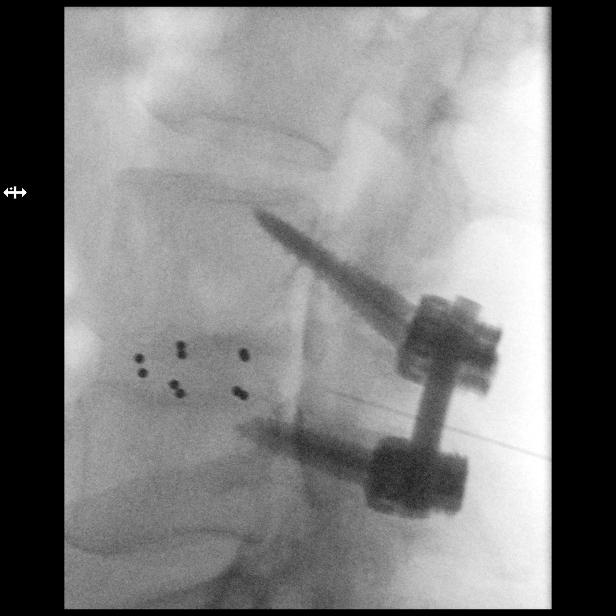

[Series 2: w lumbar spine lat · 0.15mm/px · 1 of 1 slices shown]
[im 1/1]
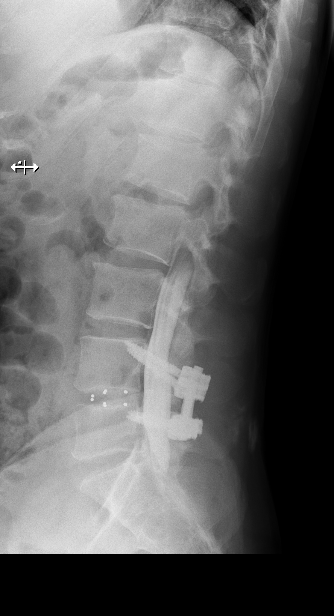

[Series 3: w lumbar spine flexion · 0.15mm/px · 1 of 1 slices shown]
[im 1/1]
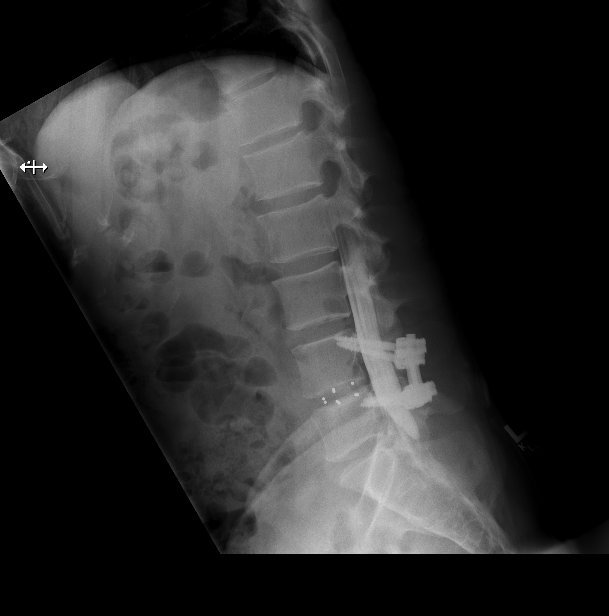

[Series 4: vasc standard · 1 of 1 slices shown (2 of 10)]
[im 1/1]
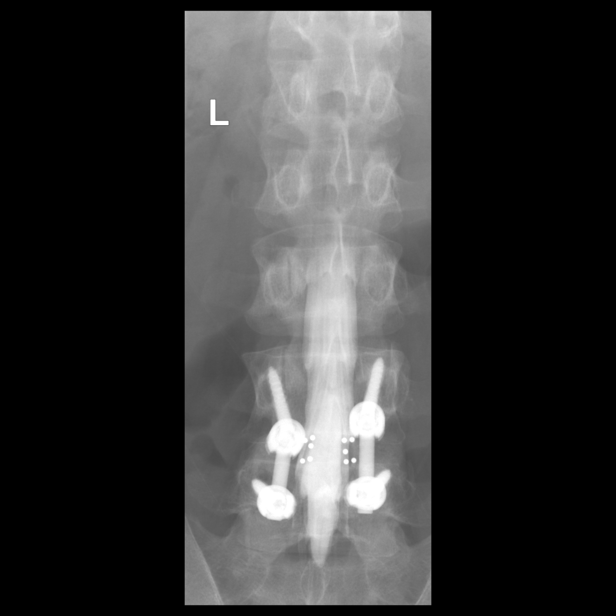

[Series 4: w lumbar spine extension · 0.15mm/px · 1 of 1 slices shown]
[im 1/1]
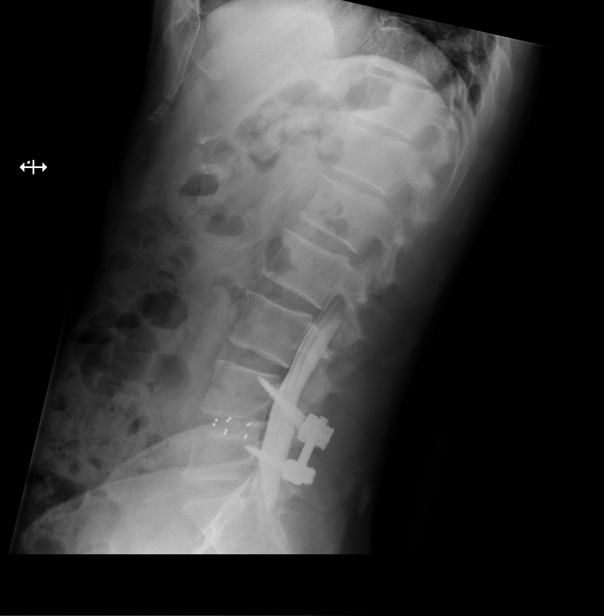

[Series 5: vasc standard · 1 of 1 slices shown (3 of 10)]
[im 1/1]
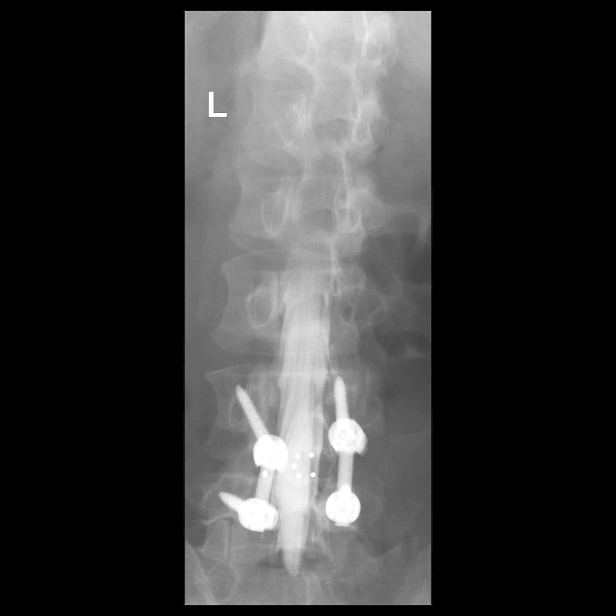

[Series 7: vasc standard · 1 of 1 slices shown (4 of 10)]
[im 1/1]
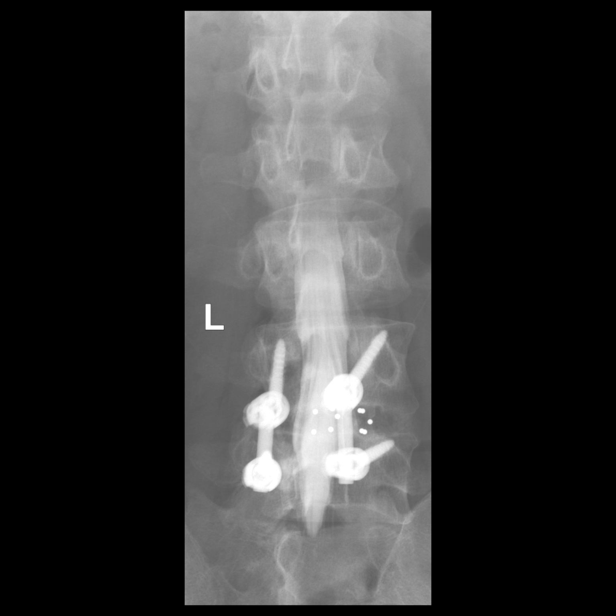

[Series 8: vasc standard · 1 of 1 slices shown (5 of 10)]
[im 1/1]
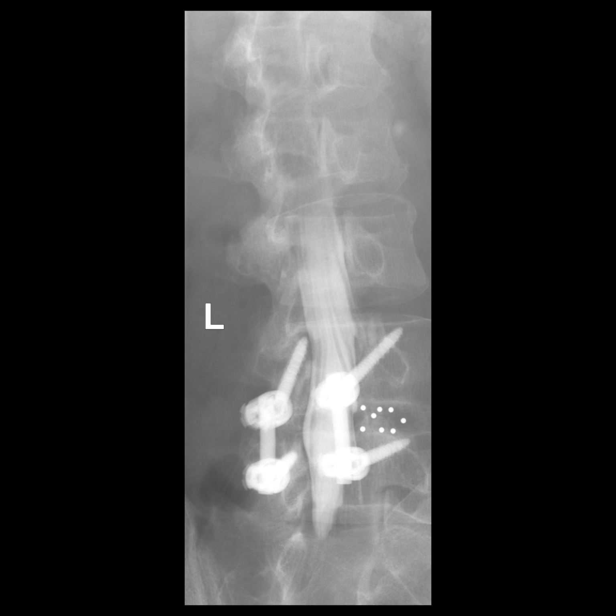

[Series 9: vasc standard · 1 of 1 slices shown (6 of 10)]
[im 1/1]
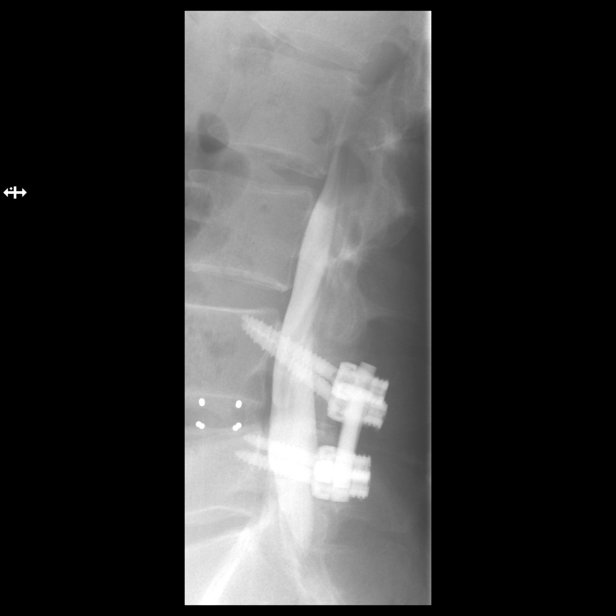

[Series 11: vasc standard · 1 of 1 slices shown (7 of 10)]
[im 1/1]
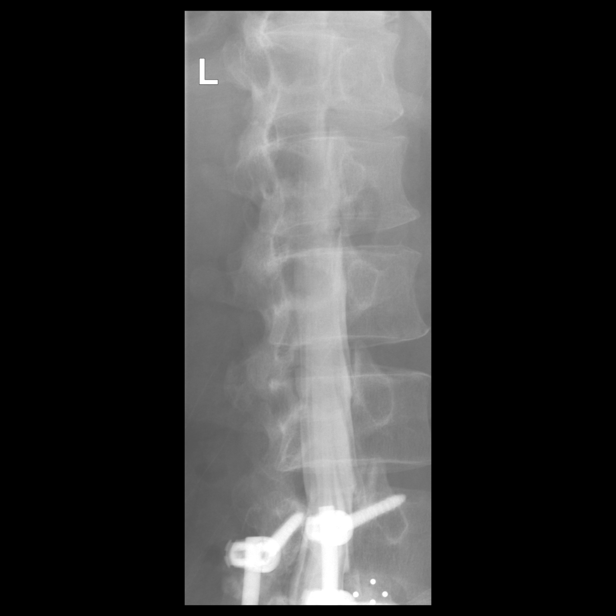

[Series 12: vasc standard · 1 of 1 slices shown (8 of 10)]
[im 1/1]
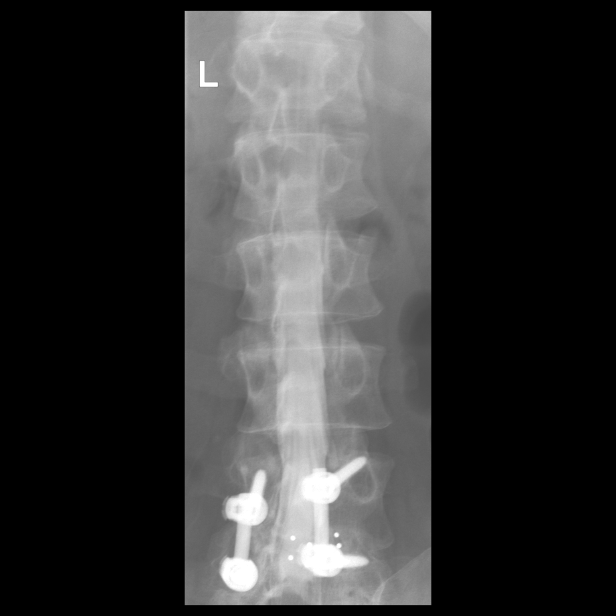

[Series 13: vasc standard · 1 of 1 slices shown (9 of 10)]
[im 1/1]
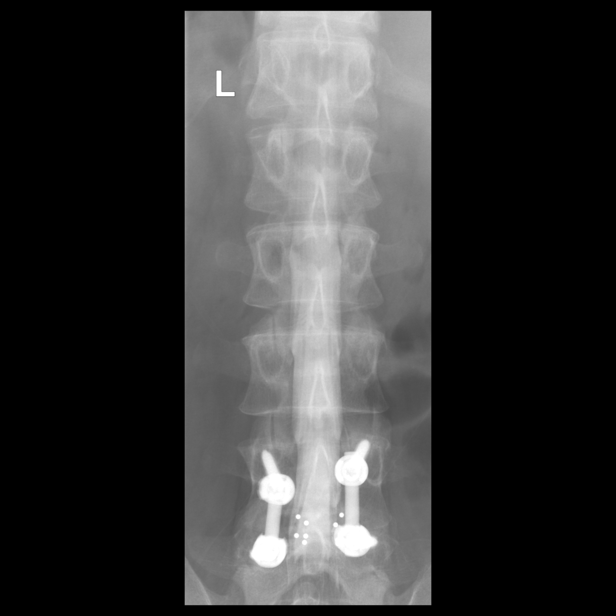

[Series 15: vasc standard · 1 of 1 slices shown (10 of 10)]
[im 1/1]
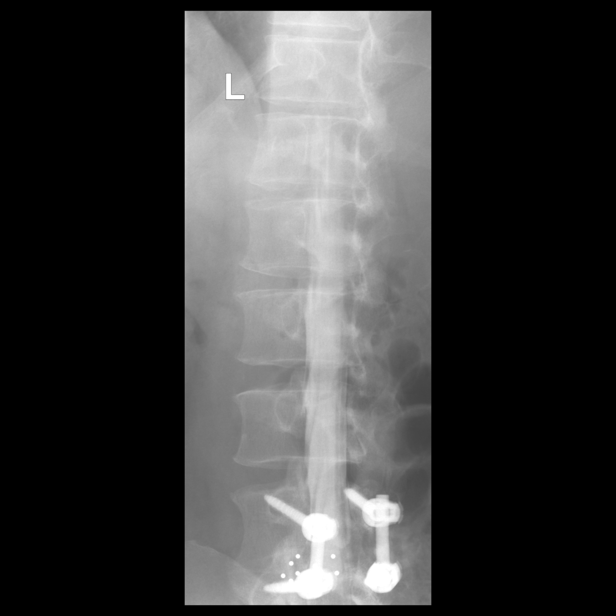

[14 of 19 positions shown; findings below may reference images not displayed]

EXAM:
LUMBAR MYELOGRAM

FLUOROSCOPY TIME:  dictate in minutes and seconds

PROCEDURE:
After thorough discussion of risks and benefits of the procedure
including bleeding, infection, injury to nerves, blood vessels,
adjacent structures as well as headache and CSF leak, written and
oral informed consent was obtained. Consent was obtained by Dr. Akther
Sophia. Time out form was completed.

Patient was positioned prone on the fluoroscopy table. Local
anesthesia was provided with 1% lidocaine without epinephrine after
prepped and draped in the usual sterile fashion. Puncture was
performed at L4-5 using a 3 1/2 inch 22-gauge spinal needle via
midline approach. Using a single pass through the dura, the needle
was placed within the thecal sac, with return of clear CSF. 15 mL of
Isovue-M 200 was injected into the thecal sac, with normal
opacification of the nerve roots and cauda equina consistent with
free flow within the subarachnoid space.

I personally performed the lumbar puncture and administered the
intrathecal contrast. I also personally supervised acquisition of
the myelogram images.
FINDINGS: LUMBAR MYELOGRAM FINDINGS:

Good opacification lumbar subarachnoid space. Status post L4-5 PLIF.
Solid arthrodesis. No nerve root cut off or spinal stenosis.
Hardware intact. Standing flexion extension radiographs demonstrate
anatomic alignment without dynamic instability.

CT LUMBAR MYELOGRAM FINDINGS:

Segmentation: Normal.

Alignment:  Normal.

Vertebrae: No worrisome osseous lesion.Solid L4-5 arthrodesis. Areas
of lucency and sclerosis in the medial iliac wings appear stable,
compared with prior MR and CT.

Conus medullaris: Normal in size, signal, and location.

Paraspinal tissues: No evidence for hydronephrosis or paravertebral
mass. Aortic atherosclerosis.

Disc levels:

L1-L2:  Normal.

L2-L3:  Normal.

L3-L4:  Normal.

L4-L5:  Solid arthrodesis.  Hardware intact.  No impingement.

L5-S1: There is a chronic central and rightward protrusion.
Calcification and osseous spurring is noted. There is mild dorsal
displacement of RIGHT S1 nerve root without frank impingement.
Similarly, there is mild BILATERAL foraminal narrowing without
significant compression of the L5 nerve roots. Mild facet
arthropathy is superimposed

Compared with [id number], the appearance at L5-S1 is fairly similar. The
appearance at L4-5 is improved.
IMPRESSION: LUMBAR MYELOGRAM IMPRESSION:

Status post L4-5 PLIF. Anatomic alignment without dynamic
instability.

No nerve root cut off or stenosis is evident at the fusion level or
elsewhere.

CT LUMBAR MYELOGRAM IMPRESSION:

Unremarkable appearing L4-5 PLIF. Solid arthrodesis. No adverse
features.

Chronic central and rightward protrusion L5-S1. Mild dorsal
displacement of the RIGHT S1 nerve root, and mild BILATERAL
foraminal narrowing, without impingement.
